# Patient Record
Sex: Male | Born: 1975
Health system: Southern US, Community
[De-identification: ages and names within clinical notes are randomized; demographics above are authoritative.]

## PROBLEM LIST (undated history)

## (undated) DIAGNOSIS — Z8489 Family history of other specified conditions: Secondary | ICD-10-CM

## (undated) DIAGNOSIS — F319 Bipolar disorder, unspecified: Secondary | ICD-10-CM

## (undated) DIAGNOSIS — F1011 Alcohol abuse, in remission: Secondary | ICD-10-CM

## (undated) DIAGNOSIS — F329 Major depressive disorder, single episode, unspecified: Secondary | ICD-10-CM

## (undated) DIAGNOSIS — L989 Disorder of the skin and subcutaneous tissue, unspecified: Secondary | ICD-10-CM

## (undated) DIAGNOSIS — E785 Hyperlipidemia, unspecified: Secondary | ICD-10-CM

## (undated) DIAGNOSIS — F32A Depression, unspecified: Secondary | ICD-10-CM

## (undated) DIAGNOSIS — G8929 Other chronic pain: Secondary | ICD-10-CM

## (undated) DIAGNOSIS — M545 Low back pain, unspecified: Secondary | ICD-10-CM

## (undated) DIAGNOSIS — R109 Unspecified abdominal pain: Secondary | ICD-10-CM

## (undated) DIAGNOSIS — Z973 Presence of spectacles and contact lenses: Secondary | ICD-10-CM

## (undated) DIAGNOSIS — F411 Generalized anxiety disorder: Secondary | ICD-10-CM

## (undated) DIAGNOSIS — T8859XA Other complications of anesthesia, initial encounter: Secondary | ICD-10-CM

## (undated) DIAGNOSIS — Z87442 Personal history of urinary calculi: Secondary | ICD-10-CM

## (undated) DIAGNOSIS — R911 Solitary pulmonary nodule: Secondary | ICD-10-CM

## (undated) HISTORY — DX: Disorder of the skin and subcutaneous tissue, unspecified: L98.9

## (undated) HISTORY — DX: Hyperlipidemia, unspecified: E78.5

## (undated) HISTORY — DX: Major depressive disorder, single episode, unspecified: F32.9

## (undated) HISTORY — DX: Unspecified abdominal pain: R10.9

## (undated) HISTORY — PX: HERNIA REPAIR: SHX51

## (undated) HISTORY — DX: Alcohol abuse, in remission: F10.11

## (undated) HISTORY — DX: Bipolar disorder, unspecified: F31.9

## (undated) HISTORY — DX: Generalized anxiety disorder: F41.1

---
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
## 2008-04-09 ENCOUNTER — Ambulatory Visit: Payer: Self-pay | Admitting: Internal Medicine

## 2008-04-09 LAB — CONVERTED CEMR LAB
AST: 18 units/L (ref 0–37)
Alkaline Phosphatase: 67 units/L (ref 39–117)
Bilirubin Urine: NEGATIVE
Bilirubin, Direct: 0.2 mg/dL (ref 0.0–0.3)
Chloride: 100 meq/L (ref 96–112)
Eosinophils Absolute: 0.2 10*3/uL (ref 0.0–0.7)
Eosinophils Relative: 2.4 % (ref 0.0–5.0)
GFR calc Af Amer: 113 mL/min
GFR calc non Af Amer: 93 mL/min
Glucose, Bld: 88 mg/dL (ref 70–99)
HCT: 49.9 % (ref 39.0–52.0)
HDL: 34.9 mg/dL — ABNORMAL LOW (ref >39.0)
Hemoglobin, Urine: NEGATIVE
Monocytes Absolute: 0.5 10*3/uL (ref 0.1–1.0)
Monocytes Relative: 6.5 % (ref 3.0–12.0)
Neutrophils Relative %: 59.5 % (ref 43.0–77.0)
Nitrite: NEGATIVE
Platelets: 202 10*3/uL (ref 150–400)
Potassium: 3.8 meq/L (ref 3.5–5.1)
RDW: 11.6 % (ref 11.5–14.6)
Sodium: 138 meq/L (ref 135–145)
Total CHOL/HDL Ratio: 5.1
Total Protein, Urine: NEGATIVE mg/dL
Triglycerides: 371 mg/dL (ref 0–149)
Urobilinogen, UA: 0.2 (ref 0.0–1.0)
VLDL: 74 mg/dL — ABNORMAL HIGH (ref 0–40)
WBC: 7 10*3/uL (ref 4.5–10.5)

## 2008-04-17 ENCOUNTER — Ambulatory Visit: Payer: Self-pay | Admitting: Internal Medicine

## 2008-04-17 DIAGNOSIS — F411 Generalized anxiety disorder: Secondary | ICD-10-CM

## 2008-04-17 DIAGNOSIS — L989 Disorder of the skin and subcutaneous tissue, unspecified: Secondary | ICD-10-CM | POA: Insufficient documentation

## 2008-04-17 DIAGNOSIS — F329 Major depressive disorder, single episode, unspecified: Secondary | ICD-10-CM

## 2008-04-17 DIAGNOSIS — F3289 Other specified depressive episodes: Secondary | ICD-10-CM

## 2008-04-17 HISTORY — DX: Generalized anxiety disorder: F41.1

## 2008-04-17 HISTORY — DX: Major depressive disorder, single episode, unspecified: F32.9

## 2008-04-17 HISTORY — DX: Other specified depressive episodes: F32.89

## 2008-04-17 HISTORY — DX: Disorder of the skin and subcutaneous tissue, unspecified: L98.9

## 2008-04-26 ENCOUNTER — Telehealth: Payer: Self-pay | Admitting: Internal Medicine

## 2008-06-06 ENCOUNTER — Ambulatory Visit: Payer: Self-pay | Admitting: Internal Medicine

## 2008-06-06 DIAGNOSIS — R109 Unspecified abdominal pain: Secondary | ICD-10-CM | POA: Insufficient documentation

## 2008-06-06 DIAGNOSIS — K4090 Unilateral inguinal hernia, without obstruction or gangrene, not specified as recurrent: Secondary | ICD-10-CM

## 2008-06-06 HISTORY — DX: Unilateral inguinal hernia, without obstruction or gangrene, not specified as recurrent: K40.90

## 2008-06-06 HISTORY — DX: Unspecified abdominal pain: R10.9

## 2009-07-10 ENCOUNTER — Ambulatory Visit: Payer: Self-pay | Admitting: Internal Medicine

## 2009-08-04 ENCOUNTER — Telehealth: Payer: Self-pay | Admitting: Internal Medicine

## 2009-08-04 ENCOUNTER — Encounter: Payer: Self-pay | Admitting: Internal Medicine

## 2009-08-05 ENCOUNTER — Telehealth: Payer: Self-pay | Admitting: Internal Medicine

## 2010-07-17 ENCOUNTER — Telehealth: Payer: Self-pay | Admitting: Internal Medicine

## 2010-07-21 ENCOUNTER — Ambulatory Visit: Payer: Self-pay | Admitting: Internal Medicine

## 2010-07-24 ENCOUNTER — Ambulatory Visit: Payer: Self-pay | Admitting: Internal Medicine

## 2010-07-25 LAB — CONVERTED CEMR LAB
AST: 22 units/L (ref 0–37)
Albumin: 4.2 g/dL (ref 3.5–5.2)
BUN: 10 mg/dL (ref 6–23)
Basophils Absolute: 0 10*3/uL (ref 0.0–0.1)
CO2: 28 meq/L (ref 19–32)
Chloride: 99 meq/L (ref 96–112)
Cholesterol: 199 mg/dL (ref 0–200)
Direct LDL: 126.7 mg/dL
Eosinophils Absolute: 0.1 10*3/uL (ref 0.0–0.7)
Glucose, Bld: 79 mg/dL (ref 70–99)
HCT: 47.2 % (ref 39.0–52.0)
HDL: 30.1 mg/dL — ABNORMAL LOW (ref >39.00)
Hemoglobin, Urine: NEGATIVE
Hemoglobin: 16.8 g/dL (ref 13.0–17.0)
Ketones, ur: NEGATIVE mg/dL
Lymphs Abs: 1.9 10*3/uL (ref 0.7–4.0)
MCHC: 35.6 g/dL (ref 30.0–36.0)
MCV: 90.2 fL (ref 78.0–100.0)
Monocytes Absolute: 0.7 10*3/uL (ref 0.1–1.0)
Monocytes Relative: 13.4 % — ABNORMAL HIGH (ref 3.0–12.0)
Neutro Abs: 2.2 10*3/uL (ref 1.4–7.7)
Potassium: 4 meq/L (ref 3.5–5.1)
RDW: 12.8 % (ref 11.5–14.6)
Sodium: 136 meq/L (ref 135–145)
TSH: 1.4 microintl units/mL (ref 0.35–5.50)
Total Protein, Urine: NEGATIVE mg/dL
Urine Glucose: NEGATIVE mg/dL

## 2010-11-06 ENCOUNTER — Ambulatory Visit
Admission: RE | Admit: 2010-11-06 | Discharge: 2010-11-06 | Payer: Self-pay | Source: Home / Self Care | Attending: Endocrinology | Admitting: Endocrinology

## 2010-11-06 DIAGNOSIS — J069 Acute upper respiratory infection, unspecified: Secondary | ICD-10-CM | POA: Insufficient documentation

## 2010-12-01 NOTE — Progress Notes (Signed)
Summary: paroxetine  Phone Note Call from Patient   Caller: Patient Reason for Call: Talk to Doctor Summary of Call: Pt left msg on vm need rx for paroxetine sent to his mail service. Pls return call back @ [phone] Initial call taken by: Orlan Leavens RMA,  July 17, 2010 12:19 PM  Follow-up for Phone Call        Called pt back can not send 90 day to mail service. Need to have yearly f/u. will send 30 day to local pharmacy until f/u appt. Follow-up by: Orlan Leavens RMA,  July 17, 2010 1:36 PM

## 2010-12-01 NOTE — Assessment & Plan Note (Signed)
Summary: PER LUCY YEARLY FU---STC   Vital Signs:  Patient profile:   35 year old male Height:      67 inches Weight:      185.25 pounds BMI:     29.12 O2 Sat:      98 % on Room air Temp:     98.3 degrees F oral Pulse rate:   67 / minute BP sitting:   114 / 72  (left arm) Cuff size:   regular  Vitals Entered By: Margaret Pyle, CMA (July 21, 2010 4:39 PM)  O2 Flow:  Room air CC: Follow up on medication   CC:  Follow up on medication.  History of Present Illness: here for wellness and f/u - overall doing well, has some adverse serotonin w/d type symptoms if he misses 3 days paxil, but overall good complaicne, good tolerability, good efficacy and he wants to continue.  No signficant sexual side effect, wt gain.  No new complaints.  Pt denies CP, worsening sob, doe, wheezing, orthopnea, pnd, worsening LE edema, palps, dizziness or syncope  Pt denies new neuro symptoms such as headache, facial or extremity weakness No headache or sleep issues.  Now working full time as Sport and exercise psychologist.  No worsening depressive symtpoms, suicidal ideation, panic.  Problems Prior to Update: 1)  Inguinal Pain, Right  (ICD-789.09) 2)  Skin Lesion  (ICD-709.9) 3)  Preventive Health Care  (ICD-V70.0) 4)  Depression  (ICD-311) 5)  Anxiety  (ICD-300.00)  Medications Prior to Update: 1)  Paxil 30 Mg Tabs (Paroxetine Hcl) .... Take 1 Once Daily  Current Medications (verified): 1)  Paxil 30 Mg Tabs (Paroxetine Hcl) .... Take 1 Once Daily  Allergies (verified): No Known Drug Allergies  Past History:  Past Medical History: Last updated: 04/17/2008 borderline HTN Anxiety Depression hx of ETOH  Past Surgical History: Last updated: 04/17/2008 congenital ureter malformation repair 1994 Inguinal herniorrhaphy - left  Family History: Last updated: 04/17/2008 several maternal males with ETOH grandfather with CAD/MI  Social History: Last updated: 07/21/2010 Divorced 1 child work  - now Investment banker, operational  Current Smoker Alcohol use-no since 3/09  Risk Factors: Smoking Status: current (04/17/2008)  Social History: Divorced 1 child work - now Investment banker, operational  Current Smoker Alcohol use-no since 3/09  Review of Systems  The patient denies anorexia, fever, weight loss, weight gain, vision loss, decreased hearing, hoarseness, chest pain, syncope, dyspnea on exertion, peripheral edema, prolonged cough, headaches, hemoptysis, abdominal pain, melena, hematochezia, severe indigestion/heartburn, hematuria, muscle weakness, suspicious skin lesions, transient blindness, difficulty walking, depression, unusual weight change, abnormal bleeding, enlarged lymph nodes, and angioedema.         all otherwise negative per pt -  except for recurrent mild right lower back with some radiaion to the right post thigh, mild , without other LE pain/weak/numb, no bowel or bladder change, no fever or gait change, injury or fall  Physical Exam  General:  alert and well-developed.   Head:  normocephalic and atraumatic.   Eyes:  vision grossly intact, pupils equal, and pupils round.   Ears:  R ear normal and L ear normal.   Nose:  no external deformity and no nasal discharge.   Mouth:  no gingival abnormalities and pharynx pink and moist.   Neck:  supple and no masses.   Lungs:  normal respiratory effort and normal breath sounds.   Heart:  normal rate and regular rhythm.   Abdomen:  soft, non-tender, and normal bowel sounds.   Msk:  no  joint tenderness and no joint swelling.   Extremities:  no edema, no erythema  Neurologic:  cranial nerves II-XII intact and strength normal in all extremities.   Skin:  color normal and no rashes.   Psych:  not anxious appearing and not depressed appearing.     Impression & Recommendations:  Problem # 1:  Preventive Health Care (ICD-V70.0) Overall doing well, age appropriate education and counseling updated and referral for appropriate preventive  services done unless declined, immunizations up to date or declined, diet counseling done if overweight, urged to quit smoking if smokes , most recent labs reviewed and current ordered if appropriate, ecg reviewed or declined (interpretation per ECG scanned in the EMR if done); information regarding Medicare Prevention requirements given if appropriate; speciality referrals updated as appropriate ; for labs later this wk per pt preference  Problem # 2:  ANXIETY (ICD-300.00)  His updated medication list for this problem includes:    Paxil 30 Mg Tabs (Paroxetine hcl) .Marland Kitchen... Take 1 once daily stable overall by hx and exam, ok to continue meds/tx as is   Complete Medication List: 1)  Paxil 30 Mg Tabs (Paroxetine hcl) .... Take 1 once daily  Other Orders: Admin 1st Vaccine (16109) Flu Vaccine 48yrs + (60454) Flu Vaccine Consent Questions     Do you have a history of severe allergic reactions to this vaccine? no    Any prior history of allergic reactions to egg and/or gelatin? no    Do you have a sensitivity to the preservative Thimersol? no    Do you have a past history of Guillan-Barre Syndrome? no    Do you currently have an acute febrile illness? no    Have you ever had a severe reaction to latex? no    Vaccine information given and explained to patient? yes    Are you currently pregnant? no    Lot Number:[id number]   Exp Date:05/01/2011   Site Given  Left Deltoid IMOrders: Admin 1st Vaccine (09811) Flu Vaccine 9yrs + (91478)  Patient Instructions: 1)  Continue all previous medications as before this visit 2)  You will be called about coming for the blood work 3)  Please call the number on the [facility] Card for results of your testing  4)  Please schedule a follow-up appointment in 1 year or sooner if needed Prescriptions: PAXIL 30 MG TABS (PAROXETINE HCL) take 1 once daily  #90 x 3   Entered and Authorized by:   Corwin Levins MD   Signed by:   Corwin Levins MD on 07/21/2010   Method used:    Print then Give to Patient   RxID:   [id number]  .lbflu

## 2010-12-03 NOTE — Assessment & Plan Note (Signed)
Summary: SINUS INFECTION/NWS   Vital Signs:  Patient profile:   35 year old male Height:      67 inches (170.18 cm) Weight:      184.50 pounds (83.86 kg) BMI:     29.00 O2 Sat:      94 % on Room air Temp:     98.5 degrees F (36.94 degrees C) oral Pulse rate:   92 / minute Pulse rhythm:   regular BP sitting:   138 / 98  (left arm) Cuff size:   regular  Vitals Entered By: Brenton Grills CMA (AAMA) (November 06, 2010 9:22 AM)  O2 Flow:  Room air CC: Sinus infection x 5 days/aj Is Patient Diabetic? No   CC:  Sinus infection x 5 days/aj.  History of Present Illness: 5 days of nasal congestion, and assoc sore throat.  Current Medications (verified): 1)  Paxil 30 Mg Tabs (Paroxetine Hcl) .... Take 1 Once Daily  Allergies (verified): No Known Drug Allergies  Past History:  Past Medical History: Last updated: 04/17/2008 borderline HTN Anxiety Depression hx of ETOH  Review of Systems  The patient denies fever and prolonged cough.         no earache  Physical Exam  General:  normal appearance.   Head:  head: no deformity eyes: no periorbital swelling, no proptosis external nose and ears are normal mouth: no lesion seen Neck:  Supple without thyroid enlargement or tenderness.  Lungs:  Clear to auscultation bilaterally. Normal respiratory effort.    Impression & Recommendations:  Problem # 1:  URI (ICD-465.9) Assessment New  Medications Added to Medication List This Visit: 1)  Azithromycin 500 Mg Tabs (Azithromycin) .Marland Kitchen.. 1 tab once daily  Other Orders: Est. Patient Level III (04540)  Patient Instructions: 1)  azithromycin 500 mg once daily 2)  loratadine-d as needed for congestion 3)  return here as needed Prescriptions: AZITHROMYCIN 500 MG TABS (AZITHROMYCIN) 1 tab once daily  #6 x 0   Entered and Authorized by:   Minus Breeding MD   Signed by:   Minus Breeding MD on 11/06/2010   Method used:   Electronically to        [facility] [phone]*  (retail)       [address], [place]  [address]       Ph: [phone] or [phone]       Fax: [phone]   RxID:   [id number]    Orders Added: 1)  Est. Patient Level III [59563]

## 2011-07-22 ENCOUNTER — Telehealth: Payer: Self-pay

## 2011-07-22 DIAGNOSIS — Z0000 Encounter for general adult medical examination without abnormal findings: Secondary | ICD-10-CM

## 2011-07-22 NOTE — Telephone Encounter (Signed)
Put order in for physical labs. 

## 2011-07-26 ENCOUNTER — Telehealth: Payer: Self-pay

## 2011-07-26 NOTE — Telephone Encounter (Signed)
Patient called to confirm that physical labs had been ordered and informed order had been put in.

## 2011-07-28 ENCOUNTER — Other Ambulatory Visit: Payer: Self-pay | Admitting: Internal Medicine

## 2011-07-28 ENCOUNTER — Telehealth: Payer: Self-pay

## 2011-07-28 ENCOUNTER — Other Ambulatory Visit (INDEPENDENT_AMBULATORY_CARE_PROVIDER_SITE_OTHER): Payer: 59

## 2011-07-28 DIAGNOSIS — F329 Major depressive disorder, single episode, unspecified: Secondary | ICD-10-CM

## 2011-07-28 DIAGNOSIS — Z0000 Encounter for general adult medical examination without abnormal findings: Secondary | ICD-10-CM

## 2011-07-28 LAB — BASIC METABOLIC PANEL
CO2: 30 mEq/L (ref 19–32)
Calcium: 9.4 mg/dL (ref 8.4–10.5)
Creatinine, Ser: 0.9 mg/dL (ref 0.4–1.5)
Glucose, Bld: 80 mg/dL (ref 70–99)

## 2011-07-28 LAB — LDL CHOLESTEROL, DIRECT: Direct LDL: 148.8 mg/dL

## 2011-07-28 LAB — LIPID PANEL
HDL: 37.2 mg/dL — ABNORMAL LOW (ref >39.00)
Total CHOL/HDL Ratio: 6
Triglycerides: 224 mg/dL — ABNORMAL HIGH (ref 0.0–149.0)
VLDL: 44.8 mg/dL — ABNORMAL HIGH (ref 0.0–40.0)

## 2011-07-28 LAB — HEPATIC FUNCTION PANEL
Albumin: 4.3 g/dL (ref 3.5–5.2)
Alkaline Phosphatase: 67 U/L (ref 39–117)
Total Protein: 7.4 g/dL (ref 6.0–8.3)

## 2011-07-28 LAB — URINALYSIS, ROUTINE W REFLEX MICROSCOPIC
Bilirubin Urine: NEGATIVE
Hgb urine dipstick: NEGATIVE
Urine Glucose: NEGATIVE
Urobilinogen, UA: 1 (ref 0.0–1.0)

## 2011-07-28 LAB — CBC WITH DIFFERENTIAL/PLATELET
Eosinophils Relative: 2.3 % (ref 0.0–5.0)
HCT: 51.2 % (ref 39.0–52.0)
Hemoglobin: 17.6 g/dL — ABNORMAL HIGH (ref 13.0–17.0)
Lymphs Abs: 1.8 10*3/uL (ref 0.7–4.0)
Monocytes Relative: 10.2 % (ref 3.0–12.0)
Neutro Abs: 3.3 10*3/uL (ref 1.4–7.7)
RDW: 12.3 % (ref 11.5–14.6)
WBC: 5.8 10*3/uL (ref 4.5–10.5)

## 2011-07-28 NOTE — Telephone Encounter (Signed)
Lab order done 

## 2011-07-28 NOTE — Telephone Encounter (Signed)
Do not know the  codes

## 2011-07-28 NOTE — Telephone Encounter (Signed)
Message copied by Pincus Sanes on Wed Jul 28, 2011  5:00 PM ------      Message from: Corwin Levins      Created: Wed Jul 28, 2011 12:23 PM      Regarding: RE: Lab order       Do we know the codes?      ----- Message -----         From: Scharlene Gloss         Sent: 07/28/2011   8:33 AM           To: Oliver Barre, MD      Subject: Lab order                                                Lab called need to put order in for Valporic acid and liver function for the patient, Dr. Arta Silence is requesting.

## 2011-07-29 ENCOUNTER — Other Ambulatory Visit (INDEPENDENT_AMBULATORY_CARE_PROVIDER_SITE_OTHER): Payer: 59

## 2011-07-29 DIAGNOSIS — F329 Major depressive disorder, single episode, unspecified: Secondary | ICD-10-CM

## 2011-07-30 LAB — HEPATIC FUNCTION PANEL
Albumin: 4.9 g/dL (ref 3.5–5.2)
Bilirubin, Direct: 0.2 mg/dL (ref 0.0–0.3)
Total Protein: 8 g/dL (ref 6.0–8.3)

## 2011-08-31 ENCOUNTER — Encounter: Payer: Self-pay | Admitting: Internal Medicine

## 2011-09-03 ENCOUNTER — Encounter: Payer: Self-pay | Admitting: Internal Medicine

## 2011-09-03 DIAGNOSIS — Z0000 Encounter for general adult medical examination without abnormal findings: Secondary | ICD-10-CM | POA: Insufficient documentation

## 2011-09-08 ENCOUNTER — Ambulatory Visit (INDEPENDENT_AMBULATORY_CARE_PROVIDER_SITE_OTHER): Payer: 59 | Admitting: Internal Medicine

## 2011-09-08 ENCOUNTER — Encounter: Payer: Self-pay | Admitting: Internal Medicine

## 2011-09-08 VITALS — BP 120/84 | HR 70 | Temp 97.8°F | Ht 67.5 in | Wt 187.1 lb

## 2011-09-08 DIAGNOSIS — Z0000 Encounter for general adult medical examination without abnormal findings: Secondary | ICD-10-CM

## 2011-09-08 DIAGNOSIS — E785 Hyperlipidemia, unspecified: Secondary | ICD-10-CM

## 2011-09-08 DIAGNOSIS — Z23 Encounter for immunization: Secondary | ICD-10-CM

## 2011-09-08 DIAGNOSIS — F319 Bipolar disorder, unspecified: Secondary | ICD-10-CM

## 2011-09-08 HISTORY — DX: Bipolar disorder, unspecified: F31.9

## 2011-09-08 HISTORY — DX: Hyperlipidemia, unspecified: E78.5

## 2011-09-08 MED ORDER — LAMOTRIGINE 100 MG PO TABS: 100.0000 mg | ORAL_TABLET | Freq: Every day | ORAL | Status: DC

## 2011-09-08 MED ORDER — DIVALPROEX SODIUM 500 MG PO DR TAB: 500.0000 mg | DELAYED_RELEASE_TABLET | Freq: Three times a day (TID) | ORAL | Status: DC

## 2011-09-08 NOTE — Progress Notes (Signed)
Addended by: Scharlene Gloss B on: 09/08/2011 11:30 AM   Modules accepted: Orders

## 2011-09-08 NOTE — Patient Instructions (Addendum)
You had the flu shot today Continue all other medications as before Please keep your appointments with your specialists as you have planned - psychiatry You are otherwise up to date today Plesae follow lower cholesterol, lower fat diet Please return in 1 year for your yearly visit, or sooner if needed, with Lab testing done 3-5 days before

## 2011-09-08 NOTE — Progress Notes (Signed)
Subjective:    Patient ID: Kyle Welch, male    DOB: 1976/10/01, 35 y.o.   MRN: [id number]  HPI  Here for wellness and f/u;  Overall doing ok;  Pt denies CP, worsening SOB, DOE, wheezing, orthopnea, PND, worsening LE edema, palpitations, dizziness or syncope.  Pt denies neurological change such as new Headache, facial or extremity weakness.  Pt denies polydipsia, polyuria, or low sugar symptoms. Pt states overall good compliance with treatment and medications, good tolerability, and trying to follow lower cholesterol diet.  Pt denies worsening depressive symptoms, suicidal ideation or panic. No fever, wt loss, night sweats, loss of appetite, or other constitutional symptoms.  Pt states good ability with ADL's, low fall risk, home safety reviewed and adequate, no significant changes in hearing or vision, and occasionally active with exercise.  Stopped abusing ETOH, has seen psychiatry, now off the paxil and on lamictal and valproic acid for bipolar, much improved.  Has some mild reflux he controlls with diet and otc meds Past Medical History  Diagnosis Date  . ANXIETY 04/17/2008  . DEPRESSION 04/17/2008  . INGUINAL PAIN, RIGHT 06/06/2008  . SKIN LESION 04/17/2008  . H/O alcohol abuse   . Bipolar affective 09/08/2011   Past Surgical History  Procedure Date  . Congenital ureter malformation repair 1994  . Hernia repair     Left    reports that he has been smoking.  He does not have any smokeless tobacco history on file. He reports that he drinks alcohol. He reports that he does not use illicit drugs. family history includes Alcohol abuse in his maternal uncle and other and Coronary artery disease in his maternal grandfather. No Known Allergies Current Outpatient Prescriptions on File Prior to Visit  Medication Sig Dispense Refill  . PARoxetine (PAXIL) 30 MG tablet Take 30 mg by mouth every morning.         Review of Systems Review of Systems  Constitutional: Negative for diaphoresis, activity  change, appetite change and unexpected weight change.  HENT: Negative for hearing loss, ear pain, facial swelling, mouth sores and neck stiffness.   Eyes: Negative for pain, redness and visual disturbance.  Respiratory: Negative for shortness of breath and wheezing.   Cardiovascular: Negative for chest pain and palpitations.  Gastrointestinal: Negative for diarrhea, blood in stool, abdominal distention and rectal pain.  Genitourinary: Negative for hematuria, flank pain and decreased urine volume.  Musculoskeletal: Negative for myalgias and joint swelling.  Skin: Negative for color change and wound.  Neurological: Negative for syncope and numbness.  Hematological: Negative for adenopathy.  Psychiatric/Behavioral: Negative for hallucinations, self-injury, decreased concentration and agitation.      Objective:   Physical Exam BP 120/84  Pulse 70  Temp(Src) 97.8 F (36.6 C) (Oral)  Ht 5' 7.5" (1.715 m)  Wt 187 lb 2 oz (84.879 kg)  BMI 28.88 kg/m2  SpO2 98% Physical Exam  VS noted Constitutional: Pt is oriented to person, place, and time. Appears well-developed and well-nourished.  HENT:  Head: Normocephalic and atraumatic.  Right Ear: External ear normal.  Left Ear: External ear normal.  Nose: Nose normal.  Mouth/Throat: Oropharynx is clear and moist.  Eyes: Conjunctivae and EOM are normal. Pupils are equal, round, and reactive to light.  Neck: Normal range of motion. Neck supple. No JVD present. No tracheal deviation present.  Cardiovascular: Normal rate, regular rhythm, normal heart sounds and intact distal pulses.   Pulmonary/Chest: Effort normal and breath sounds normal.  Abdominal: Soft. Bowel sounds are normal. There  is no tenderness.  Musculoskeletal: Normal range of motion. Exhibits no edema.  Lymphadenopathy:  Has no cervical adenopathy.  Neurological: Pt is alert and oriented to person, place, and time. Pt has normal reflexes. No cranial nerve deficit.  Skin: Skin is  warm and dry. No rash noted.  Psychiatric:  Has  normal mood and affect. Behavior is normal. Not agitated, anxious or depressed affect today    Assessment & Plan:

## 2011-11-09 ENCOUNTER — Ambulatory Visit: Payer: 59 | Admitting: Professional

## 2013-09-26 ENCOUNTER — Emergency Department (HOSPITAL_COMMUNITY)
Admission: EM | Admit: 2013-09-26 | Discharge: 2013-09-26 | Disposition: A | Discharge status: Home / Self Care | Payer: BC Managed Care – PPO | Source: Home / Self Care | Attending: Emergency Medicine | Admitting: Emergency Medicine

## 2013-09-26 ENCOUNTER — Encounter (HOSPITAL_COMMUNITY): Payer: Self-pay | Admitting: Emergency Medicine

## 2013-09-26 DIAGNOSIS — M779 Enthesopathy, unspecified: Secondary | ICD-10-CM

## 2013-09-26 MED ORDER — MELOXICAM 15 MG PO TABS: 15.0000 mg | ORAL_TABLET | Freq: Every day | ORAL | Status: DC

## 2013-09-26 MED ORDER — HYDROCODONE-ACETAMINOPHEN 5-325 MG PO TABS: ORAL_TABLET | ORAL | Status: DC

## 2013-09-26 NOTE — ED Provider Notes (Signed)
Chief Complaint:   Chief Complaint  Patient presents with  . Arm Pain    History of Present Illness:   Kyle Welch is a 37 year old male who did some yard work 5 days ago. He worked for hours in his yard using a lunar. Thereafter he had pain in his left wrist overlying the radial styloid and over the dorsum of the right hand. There was swelling of both of these areas. It hurts him to use his hands. He denies any numbness or tingling. She's never had anything like this before.  Review of Systems:  Other than noted above, the patient denies any of the following symptoms: Systemic:  No fevers, chills, or sweats.  No fatigue or tiredness. Musculoskeletal:  No joint pain, arthritis, bursitis, swelling, back pain, or neck pain.  Neurological:  No muscular weakness, paresthesias.  PMFSH:  Past medical history, family history, social history, meds, and allergies were reviewed.  No history of gout.    Physical Exam:   Vital signs:  BP 136/99  Pulse 70  Temp(Src) 97.6 F (36.4 C) (Oral)  Resp 16  SpO2 100% Gen:  Alert and oriented times 3.  In no distress. Musculoskeletal:  Exam of the hand reveals there is pain to palpation over the radial styloid of the left hand and over the course of the extensor tendon of the thumb. There is pain with movement of the thumb and he has a positive Finkelstein's test. There is no pain over the remainder of the wrist or the dorsum of the hand and he has not had pain over the MCP joints, PIPs, or DIPs. Exam of the right hand reveals pain and swelling over the dorsum of the hand. No pain over the wrist, MCPs, PIPs, DIPs on the right. Lourena Simmonds test was negative on the right.  Otherwise, all joints had a full a ROM with no swelling, bruising or deformity.  No edema, pulses full. Extremities were warm and pink.  Capillary refill was brisk.  Skin:  Clear, warm and dry.  No rash. Neuro:  Alert and oriented times 3.  Muscle strength was normal.  Sensation was intact to  light touch.   Course in Urgent Care Center:   The left hand was placed in a thumb spica.  Assessment:  The encounter diagnosis was Tendonitis.  He appears to have de Quervain synovitis on the left and extensor tendinitis on the right. Both of these are due to overuse, secondary to yard work that he did at onset of symptoms. He needs to rest his hands and arms for the next couple weeks. He was given Mobic and Norco for the pain. I offered him an a corticosteroid injection in the left wrist, but he declined.  Plan:   1.  Meds:  The following meds were prescribed:   Discharge Medication List as of 09/26/2013  5:10 PM    START taking these medications   Details  HYDROcodone-acetaminophen (NORCO/VICODIN) 5-325 MG per tablet 1 to 2 tabs every 4 to 6 hours as needed for pain., Print    meloxicam (MOBIC) 15 MG tablet Take 1 tablet (15 mg total) by mouth daily., Starting 09/26/2013, Until Discontinued, Normal        2.  Patient Education/Counseling:  The patient was given appropriate handouts, self care instructions, and instructed in symptomatic relief, including rest and activity, elevation, application of ice and compression.   3.  Follow up:  The patient was told to follow up if no better in 3  to 4 days, if becoming worse in any way, and given some red flag symptoms such as worsening pain which would prompt immediate return.  Follow up with Dr. Izora Ribas if no improvement in 2 weeks.      Reuben Likes, MD 09/26/13 2100

## 2013-09-26 NOTE — ED Notes (Signed)
Overuse of both hands doing yard work

## 2013-11-09 ENCOUNTER — Ambulatory Visit: Payer: BC Managed Care – PPO | Admitting: Internal Medicine

## 2013-11-15 ENCOUNTER — Other Ambulatory Visit: Payer: Self-pay | Admitting: Internal Medicine

## 2013-11-15 DIAGNOSIS — R222 Localized swelling, mass and lump, trunk: Secondary | ICD-10-CM

## 2013-11-19 ENCOUNTER — Other Ambulatory Visit: Payer: BC Managed Care – PPO

## 2013-11-29 ENCOUNTER — Other Ambulatory Visit: Payer: BC Managed Care – PPO

## 2013-12-20 ENCOUNTER — Ambulatory Visit (INDEPENDENT_AMBULATORY_CARE_PROVIDER_SITE_OTHER): Payer: BC Managed Care – PPO | Admitting: General Surgery

## 2014-04-22 ENCOUNTER — Encounter (INDEPENDENT_AMBULATORY_CARE_PROVIDER_SITE_OTHER): Payer: Self-pay | Admitting: General Surgery

## 2014-04-22 ENCOUNTER — Ambulatory Visit (INDEPENDENT_AMBULATORY_CARE_PROVIDER_SITE_OTHER): Payer: BC Managed Care – PPO | Admitting: General Surgery

## 2014-04-22 DIAGNOSIS — D179 Benign lipomatous neoplasm, unspecified: Secondary | ICD-10-CM

## 2014-04-22 NOTE — Patient Instructions (Signed)
Please call and tell us whether you would like to have the surgery or not.

## 2014-04-22 NOTE — Progress Notes (Signed)
Patient ID: Kyle Welch, male   DOB: 1976-04-16, 38 y.o.   MRN: [id number]  No chief complaint on file.   HPI Kyle Welch is a 38 y.o. male.   HPI  He is referred by Dr. Shelia Media for further evaluation of a left chest wall soft tissue mass.  This is an present for about 5-6 years. Sometimes there is a little hypersensitivity in the skin surrounding the area. He's noticed a little bit of the inferior extension recently. There is been no significant increase in size however  Past Medical History  Diagnosis Date  . ANXIETY 04/17/2008  . DEPRESSION 04/17/2008  . INGUINAL PAIN, RIGHT 06/06/2008  . SKIN LESION 04/17/2008  . H/O alcohol abuse   . Bipolar affective 09/08/2011  . Hyperlipidemia 09/08/2011    Past Surgical History  Procedure Laterality Date  . Congenital ureter malformation repair  1994  . Hernia repair      Left    Family History  Problem Relation Age of Onset  . Alcohol abuse Maternal Uncle     several  . Coronary artery disease Maternal Grandfather   . Alcohol abuse Other     Social History History  Substance Use Topics  . Smoking status: Current Every Day Smoker  . Smokeless tobacco: Not on file  . Alcohol Use: Yes     Comment: none sine 12/2007    No Known Allergies  Current Outpatient Prescriptions  Medication Sig Dispense Refill  . divalproex (DEPAKOTE) 500 MG DR tablet Take 1 tablet (500 mg total) by mouth 3 (three) times daily.  30 tablet  11  . HYDROcodone-acetaminophen (NORCO/VICODIN) 5-325 MG per tablet 1 to 2 tabs every 4 to 6 hours as needed for pain.  20 tablet  0  . lamoTRIgine (LAMICTAL) 100 MG tablet Take 1 tablet (100 mg total) by mouth at bedtime.  30 tablet  11  . meloxicam (MOBIC) 15 MG tablet Take 1 tablet (15 mg total) by mouth daily.  15 tablet  1   No current facility-administered medications for this visit.    Review of Systems Review of Systems  Constitutional: Negative.   Respiratory: Negative.   Cardiovascular: Positive for chest  pain (Occasional at site of soft tissue mass).  Gastrointestinal: Negative.   Hematological: Negative.     There were no vitals taken for this visit.  Physical Exam Physical Exam  Constitutional: He appears well-developed and well-nourished. No distress.  HENT:  Head: Normocephalic and atraumatic.  Pulmonary/Chest:  2.5 cm mobile soft tissue mass left lower chest area. There is a small inferior extension of this.  Abdominal:  No abdominal wall masses. Left flank scar.    Data Reviewed Note from Dr. Shelia Media  Assessment    Left chest wall soft tissue mass. Clinically most consistent with a lipoma. Causes some occasional discomfort and has slightly changed.     Plan    We discussed removal of the mass or continued expectant management. I went over the procedure and risks of removal. Risks include but are not limited to bleeding, infection, wound healing problems, reaction to anesthesia. He would like to go home and discuss it with his wife and will call us back with his decision.        Bracken Moffa J 04/22/2014, 3:29 PM

## 2015-06-16 ENCOUNTER — Other Ambulatory Visit: Payer: Self-pay | Admitting: Orthopaedic Surgery

## 2015-06-16 DIAGNOSIS — M545 Low back pain: Secondary | ICD-10-CM

## 2015-06-23 ENCOUNTER — Ambulatory Visit
Admission: RE | Admit: 2015-06-23 | Discharge: 2015-06-23 | Disposition: A | Discharge status: Home / Self Care | Payer: BLUE CROSS/BLUE SHIELD | Source: Ambulatory Visit | Attending: Orthopaedic Surgery | Admitting: Orthopaedic Surgery

## 2015-06-23 DIAGNOSIS — M545 Low back pain: Secondary | ICD-10-CM

## 2016-02-10 DIAGNOSIS — H52223 Regular astigmatism, bilateral: Secondary | ICD-10-CM | POA: Diagnosis not present

## 2016-03-16 DIAGNOSIS — F419 Anxiety disorder, unspecified: Secondary | ICD-10-CM | POA: Diagnosis not present

## 2016-03-16 DIAGNOSIS — F902 Attention-deficit hyperactivity disorder, combined type: Secondary | ICD-10-CM | POA: Diagnosis not present

## 2016-03-16 DIAGNOSIS — G479 Sleep disorder, unspecified: Secondary | ICD-10-CM | POA: Diagnosis not present

## 2016-03-16 DIAGNOSIS — Z79899 Other long term (current) drug therapy: Secondary | ICD-10-CM | POA: Diagnosis not present

## 2016-04-06 DIAGNOSIS — M79642 Pain in left hand: Secondary | ICD-10-CM | POA: Diagnosis not present

## 2016-04-06 DIAGNOSIS — M25562 Pain in left knee: Secondary | ICD-10-CM | POA: Diagnosis not present

## 2016-04-12 DIAGNOSIS — M5416 Radiculopathy, lumbar region: Secondary | ICD-10-CM | POA: Diagnosis not present

## 2016-04-13 ENCOUNTER — Other Ambulatory Visit: Payer: Self-pay | Admitting: Orthopedic Surgery

## 2016-04-13 DIAGNOSIS — M5416 Radiculopathy, lumbar region: Secondary | ICD-10-CM

## 2016-04-20 ENCOUNTER — Ambulatory Visit
Admission: RE | Admit: 2016-04-20 | Discharge: 2016-04-20 | Disposition: A | Discharge status: Home / Self Care | Payer: BLUE CROSS/BLUE SHIELD | Source: Ambulatory Visit | Attending: Orthopedic Surgery | Admitting: Orthopedic Surgery

## 2016-04-20 DIAGNOSIS — M4806 Spinal stenosis, lumbar region: Secondary | ICD-10-CM | POA: Diagnosis not present

## 2016-04-20 DIAGNOSIS — M5416 Radiculopathy, lumbar region: Secondary | ICD-10-CM

## 2016-04-27 DIAGNOSIS — S83207A Unspecified tear of unspecified meniscus, current injury, left knee, initial encounter: Secondary | ICD-10-CM | POA: Diagnosis not present

## 2016-05-11 DIAGNOSIS — M25562 Pain in left knee: Secondary | ICD-10-CM | POA: Diagnosis not present

## 2016-07-26 DIAGNOSIS — Z1389 Encounter for screening for other disorder: Secondary | ICD-10-CM | POA: Diagnosis not present

## 2016-07-26 DIAGNOSIS — G4752 REM sleep behavior disorder: Secondary | ICD-10-CM | POA: Diagnosis not present

## 2016-07-26 DIAGNOSIS — F418 Other specified anxiety disorders: Secondary | ICD-10-CM | POA: Diagnosis not present

## 2016-07-26 DIAGNOSIS — Z0000 Encounter for general adult medical examination without abnormal findings: Secondary | ICD-10-CM | POA: Diagnosis not present

## 2016-07-26 DIAGNOSIS — E781 Pure hyperglyceridemia: Secondary | ICD-10-CM | POA: Diagnosis not present

## 2016-07-26 DIAGNOSIS — Z23 Encounter for immunization: Secondary | ICD-10-CM | POA: Diagnosis not present

## 2016-08-31 DIAGNOSIS — K4090 Unilateral inguinal hernia, without obstruction or gangrene, not specified as recurrent: Secondary | ICD-10-CM | POA: Diagnosis not present

## 2016-08-31 DIAGNOSIS — F418 Other specified anxiety disorders: Secondary | ICD-10-CM | POA: Diagnosis not present

## 2016-09-27 DIAGNOSIS — R1084 Generalized abdominal pain: Secondary | ICD-10-CM | POA: Diagnosis not present

## 2016-09-27 DIAGNOSIS — N201 Calculus of ureter: Secondary | ICD-10-CM | POA: Diagnosis not present

## 2016-09-28 ENCOUNTER — Other Ambulatory Visit: Payer: Self-pay | Admitting: Urology

## 2016-09-28 ENCOUNTER — Encounter (HOSPITAL_COMMUNITY): Payer: Self-pay | Admitting: General Practice

## 2016-09-29 ENCOUNTER — Encounter (HOSPITAL_COMMUNITY): Payer: Self-pay | Admitting: *Deleted

## 2016-09-29 DIAGNOSIS — K4090 Unilateral inguinal hernia, without obstruction or gangrene, not specified as recurrent: Secondary | ICD-10-CM | POA: Diagnosis not present

## 2016-09-30 ENCOUNTER — Encounter (HOSPITAL_COMMUNITY): Payer: Self-pay | Admitting: *Deleted

## 2016-09-30 ENCOUNTER — Ambulatory Visit (HOSPITAL_COMMUNITY)
Admission: RE | Admit: 2016-09-30 | Discharge: 2016-09-30 | Disposition: A | Discharge status: Home / Self Care | Payer: BLUE CROSS/BLUE SHIELD | Source: Ambulatory Visit | Attending: Urology | Admitting: Urology

## 2016-09-30 ENCOUNTER — Ambulatory Visit (HOSPITAL_COMMUNITY): Payer: BLUE CROSS/BLUE SHIELD

## 2016-09-30 ENCOUNTER — Encounter (HOSPITAL_COMMUNITY)
Admission: RE | Disposition: A | Discharge status: Home / Self Care | Payer: Self-pay | Source: Ambulatory Visit | Attending: Urology

## 2016-09-30 DIAGNOSIS — N201 Calculus of ureter: Secondary | ICD-10-CM | POA: Insufficient documentation

## 2016-09-30 DIAGNOSIS — Z87891 Personal history of nicotine dependence: Secondary | ICD-10-CM | POA: Diagnosis not present

## 2016-09-30 HISTORY — DX: Personal history of urinary calculi: Z87.442

## 2016-09-30 HISTORY — DX: Family history of other specified conditions: Z84.89

## 2016-09-30 HISTORY — PX: EXTRACORPOREAL SHOCK WAVE LITHOTRIPSY: SHX1557

## 2016-09-30 SURGERY — LITHOTRIPSY, ESWL
Anesthesia: LOCAL | Laterality: Right

## 2016-09-30 MED ORDER — CIPROFLOXACIN HCL 500 MG PO TABS
500.0000 mg | ORAL_TABLET | ORAL | Status: AC
  Administered 2016-09-30: 500 mg via ORAL
  Filled 2016-09-30: qty 1

## 2016-09-30 MED ORDER — DIAZEPAM 5 MG PO TABS
10.0000 mg | ORAL_TABLET | ORAL | Status: AC
  Administered 2016-09-30: 10 mg via ORAL
  Filled 2016-09-30: qty 2

## 2016-09-30 MED ORDER — DIPHENHYDRAMINE HCL 25 MG PO CAPS
25.0000 mg | ORAL_CAPSULE | ORAL | Status: AC
  Administered 2016-09-30: 25 mg via ORAL
  Filled 2016-09-30: qty 1

## 2016-09-30 MED ORDER — SODIUM CHLORIDE 0.9 % IV SOLN
INTRAVENOUS | Status: DC
  Administered 2016-09-30: 07:00:00 via INTRAVENOUS

## 2016-09-30 NOTE — Op Note (Signed)
See [employer] OP note scanned into chart. Also because of the size, density, location and other factors that cannot be anticipated I feel this will likely be a staged procedure. This fact supersedes any indication in the scanned [facility] operative note to the contrary.  Poor fragmentation.

## 2016-09-30 NOTE — H&P (Signed)
Eval of flank pain  HPI: Kyle Welch is a 40 year-old male established patient who is here for further eval and management of flank pain.  The problem is on the right side. His pain started about approximately 09/22/2016. The pain is sharp. The pain is intermittent. The intensity of his pain is rated as a 10. The pain does radiate.   None< makes the pain better. Walking makes the pain worse. He has not been treated with any pain medications.   He has not had this same pain previously. He has not had kidney stones. He did not see the blood in his urine. The patient denies any progressive voiding symptoms. He has not had fever and chills.   The patient's pain is intermittent. In between episodes of colicky has a constant pressure. He is not taking any pain medication for this pain. He is on tamsulosin as prescribed by the company physician on staff at his office.   The patient denies nausea/vomiting.   1 year ago, the patient was noted to have a small right lower pole nonobstructing stone.     ALLERGIES: Codeine Derivatives    MEDICATIONS: Flomax  Trintellix     GU PSH: None     PSH Notes: Ureter Procedures, Inguinal Hernia Repair   NON-GU PSH: Laparoscope Proc; Ureter - 08/25/2015    GU PMH: None   NON-GU PMH: Anxiety, Anxiety and depression - 08/25/2015 Congenital malformation of kidney, unspecified, Renal anomaly - 08/25/2015 Encounter for general adult medical examination without abnormal findings, Encounter for preventive health examination - 08/25/2015    FAMILY HISTORY: No pertinent family history - Runs In Family   SOCIAL HISTORY: Marital Status: Single Current Smoking Status: Patient does not smoke anymore.  Does drink.  Drinks 1 caffeinated drink per day.     Notes: Married, Caffeine use, Occupation, Alcohol use, Former smoker, Number of children   REVIEW OF SYSTEMS:    GU Review Male:   Patient reports get up at night to urinate. Patient denies frequent  urination, erection problems, penile pain, hard to postpone urination, have to strain to urinate , trouble starting your stream, stream starts and stops, burning/ pain with urination, and leakage of urine.  Gastrointestinal (Upper):   Patient denies nausea, vomiting, and indigestion/ heartburn.  Gastrointestinal (Lower):   Patient denies diarrhea and constipation.  Constitutional:   Patient denies fever, night sweats, weight loss, and fatigue.  Skin:   Patient denies skin rash/ lesion and itching.  Eyes:   Patient denies blurred vision and double vision.  Ears/ Nose/ Throat:   Patient denies sore throat and sinus problems.  Hematologic/Lymphatic:   Patient denies swollen glands and easy bruising.  Cardiovascular:   Patient denies leg swelling and chest pains.  Respiratory:   Patient denies cough and shortness of breath.  Endocrine:   Patient denies excessive thirst.  Musculoskeletal:   Patient denies back pain and joint pain.  Neurological:   Patient denies headaches and dizziness.  Psychologic:   Patient denies depression and anxiety.   VITAL SIGNS:      09/27/2016 03:08 PM  Weight 170 lb / 77.11 kg  Height 67 in / 170.18 cm  BP 144/97 mmHg  Pulse 90 /min  BMI 26.6 kg/m   MULTI-SYSTEM PHYSICAL EXAMINATION:    Constitutional: Well-nourished. No physical deformities. Normally developed. Good grooming.  Neck: Neck symmetrical, not swollen. Normal tracheal position.  Respiratory: No labored breathing, no use of accessory muscles. Clear to auscultation  Cardiovascular: Normal temperature,  normal extremity pulses, no swelling, no varicosities. Regular rate and rhythm  Lymphatic: No enlargement of neck, axillae, groin.  Skin: No paleness, no jaundice, no cyanosis. No lesion, no ulcer, no rash.  Neurologic / Psychiatric: Oriented to time, oriented to place, oriented to person. No depression, no anxiety, no agitation.  Gastrointestinal: No mass, no tenderness, no rigidity, non obese abdomen.   Eyes: Normal conjunctivae. Normal eyelids.  Ears, Nose, Mouth, and Throat: Left ear no scars, no lesions, no masses. Right ear no scars, no lesions, no masses. Nose no scars, no lesions, no masses. Normal hearing. Normal lips.  Musculoskeletal: Normal gait and station of head and neck.     PAST DATA REVIEWED:  Source Of History:  Patient  Records Review:   Previous Patient Records   PROCEDURES:         KUB - 74000  A single view of the abdomen is obtained. Renal shadows are easily visualized bilaterally. There are no stones appreciated within the expected location in either renal pelvis. There are no additional calcifications along the expected location of either ureter bilaterally.  Gas pattern is grossly normal. No significant bony abnormalities.      There is a 4.853.2 mm stone in the proximal right ureter around the L3/L4 level consistent with a UPJ stone. This stone was not present on the patient's CT scan in November 2016.         Urinalysis w/Scope Dipstick Dipstick Cont'd Micro  Color: Yellow Bilirubin: Neg WBC/hpf: 0 - 5/hpf  Appearance: Clear Ketones: Neg RBC/hpf: 0 - 2/hpf  Specific Gravity: 1.025 Blood: 2+ Bacteria: Rare (0-9/hpf)  pH: 5.5 Protein: Neg Cystals: NS (Not Seen)  Glucose: Neg Urobilinogen: 0.2 Casts: NS (Not Seen)    Nitrites: Neg Trichomonas: Not Present    Leukocyte Esterase: Neg Mucous: Present      Epithelial Cells: NS (Not Seen)      Yeast: NS (Not Seen)      Sperm: Not Present    ASSESSMENT:      ICD-10 Details  1 GU:   Flank Pain - R10.84   2   Calculus Ureter - N20.1    PLAN:            Medications New Meds: Oxycodone Hcl 5 mg tablet 1-2 tablet PO Q 6 H   #20  0 Refill(s)            Orders X-Rays: KUB  X-Ray Notes: 09/28/16          Schedule         Document Letter(s):  Created for Patient: Clinical Summary    We discussed management options including medical expulsion therapy, shockwave lithotripsy, and ureteroscopy.  Ultimately, the patient has opted for shock wave lithotripsy. I discussed with the patient the procedure in detail as well as the risk and benefits. The patient is aware that she may need additional procedures. She also is aware of the risks of hematoma and pain. We will try to get this patient's scheduled as soon as possible.

## 2016-09-30 NOTE — Discharge Instructions (Signed)
Moderate Conscious Sedation, Adult, Care After °These instructions provide you with information about caring for yourself after your procedure. Your health care provider may also give you more specific instructions. Your treatment has been planned according to current medical practices, but problems sometimes occur. Call your health care provider if you have any problems or questions after your procedure. °What can I expect after the procedure? °After your procedure, it is common: °· To feel sleepy for several hours. °· To feel clumsy and have poor balance for several hours. °· To have poor judgment for several hours. °· To vomit if you eat too soon. °Follow these instructions at home: °For at least 24 hours after the procedure:  ° °· Do not: °¨ Participate in activities where you could fall or become injured. °¨ Drive. °¨ Use heavy machinery. °¨ Drink alcohol. °¨ Take sleeping pills or medicines that cause drowsiness. °¨ Make important decisions or sign legal documents. °¨ Take care of children on your own. °· Rest. °Eating and drinking  °· Follow the diet recommended by your health care provider. °· If you vomit: °¨ Drink water, juice, or soup when you can drink without vomiting. °¨ Make sure you have little or no nausea before eating solid foods. °General instructions  °· Have a responsible adult stay with you until you are awake and alert. °· Take over-the-counter and prescription medicines only as told by your health care provider. °· If you smoke, do not smoke without supervision. °· Keep all follow-up visits as told by your health care provider. This is important. °Contact a health care provider if: °· You keep feeling nauseous or you keep vomiting. °· You feel light-headed. °· You develop a rash. °· You have a fever. °Get help right away if: °· You have trouble breathing. °This information is not intended to replace advice given to you by your health care provider. Make sure you discuss any questions you  have with your health care provider. °Document Released: 08/08/2013 Document Revised: 03/22/2016 Document Reviewed: 02/07/2016 °Elsevier Interactive Patient Education © 2017 [employer] ° ° ° °See [facility] discharge instructions in chart. °

## 2016-10-14 DIAGNOSIS — N201 Calculus of ureter: Secondary | ICD-10-CM | POA: Diagnosis not present

## 2016-10-18 DIAGNOSIS — M5416 Radiculopathy, lumbar region: Secondary | ICD-10-CM | POA: Diagnosis not present

## 2016-10-18 DIAGNOSIS — M545 Low back pain: Secondary | ICD-10-CM | POA: Diagnosis not present

## 2016-10-22 ENCOUNTER — Other Ambulatory Visit: Payer: Self-pay | Admitting: Orthopedic Surgery

## 2016-10-22 DIAGNOSIS — M5416 Radiculopathy, lumbar region: Secondary | ICD-10-CM | POA: Diagnosis not present

## 2016-10-26 ENCOUNTER — Encounter (HOSPITAL_COMMUNITY): Payer: Self-pay | Admitting: *Deleted

## 2016-10-27 ENCOUNTER — Ambulatory Visit (HOSPITAL_COMMUNITY): Payer: BLUE CROSS/BLUE SHIELD | Admitting: Anesthesiology

## 2016-10-27 ENCOUNTER — Ambulatory Visit (HOSPITAL_COMMUNITY): Payer: BLUE CROSS/BLUE SHIELD

## 2016-10-27 ENCOUNTER — Inpatient Hospital Stay (HOSPITAL_COMMUNITY)
Admission: RE | Admit: 2016-10-27 | Discharge: 2016-10-28 | DRG: 519 | Disposition: A | Discharge status: Home / Self Care | Payer: BLUE CROSS/BLUE SHIELD | Source: Ambulatory Visit | Attending: Orthopedic Surgery | Admitting: Orthopedic Surgery

## 2016-10-27 ENCOUNTER — Encounter (HOSPITAL_COMMUNITY): Payer: Self-pay | Admitting: Anesthesiology

## 2016-10-27 ENCOUNTER — Encounter (HOSPITAL_COMMUNITY)
Admission: RE | Disposition: A | Discharge status: Home / Self Care | Payer: Self-pay | Source: Ambulatory Visit | Attending: Orthopedic Surgery

## 2016-10-27 DIAGNOSIS — M5117 Intervertebral disc disorders with radiculopathy, lumbosacral region: Principal | ICD-10-CM | POA: Diagnosis present

## 2016-10-27 DIAGNOSIS — L989 Disorder of the skin and subcutaneous tissue, unspecified: Secondary | ICD-10-CM | POA: Diagnosis not present

## 2016-10-27 DIAGNOSIS — Z419 Encounter for procedure for purposes other than remedying health state, unspecified: Secondary | ICD-10-CM

## 2016-10-27 DIAGNOSIS — E785 Hyperlipidemia, unspecified: Secondary | ICD-10-CM | POA: Diagnosis not present

## 2016-10-27 DIAGNOSIS — M5410 Radiculopathy, site unspecified: Secondary | ICD-10-CM | POA: Diagnosis present

## 2016-10-27 DIAGNOSIS — Z885 Allergy status to narcotic agent status: Secondary | ICD-10-CM

## 2016-10-27 DIAGNOSIS — G9611 Dural tear: Secondary | ICD-10-CM | POA: Diagnosis not present

## 2016-10-27 DIAGNOSIS — M5127 Other intervertebral disc displacement, lumbosacral region: Secondary | ICD-10-CM | POA: Diagnosis not present

## 2016-10-27 DIAGNOSIS — Z87891 Personal history of nicotine dependence: Secondary | ICD-10-CM | POA: Diagnosis not present

## 2016-10-27 DIAGNOSIS — M62830 Muscle spasm of back: Secondary | ICD-10-CM | POA: Diagnosis not present

## 2016-10-27 DIAGNOSIS — Z01818 Encounter for other preprocedural examination: Secondary | ICD-10-CM

## 2016-10-27 DIAGNOSIS — M5416 Radiculopathy, lumbar region: Secondary | ICD-10-CM | POA: Diagnosis not present

## 2016-10-27 DIAGNOSIS — F319 Bipolar disorder, unspecified: Secondary | ICD-10-CM | POA: Diagnosis not present

## 2016-10-27 DIAGNOSIS — D171 Benign lipomatous neoplasm of skin and subcutaneous tissue of trunk: Secondary | ICD-10-CM | POA: Diagnosis not present

## 2016-10-27 DIAGNOSIS — M5417 Radiculopathy, lumbosacral region: Secondary | ICD-10-CM | POA: Diagnosis not present

## 2016-10-27 HISTORY — PX: LUMBAR LAMINECTOMY/DECOMPRESSION MICRODISCECTOMY: SHX5026

## 2016-10-27 LAB — PROTIME-INR
INR: 0.97
PROTHROMBIN TIME: 12.9 s (ref 11.4–15.2)

## 2016-10-27 LAB — CBC WITH DIFFERENTIAL/PLATELET
Basophils Absolute: 0 10*3/uL (ref 0.0–0.1)
Basophils Relative: 0 %
EOS ABS: 0.1 10*3/uL (ref 0.0–0.7)
EOS PCT: 1 %
HCT: 46.3 % (ref 39.0–52.0)
Hemoglobin: 16.6 g/dL (ref 13.0–17.0)
LYMPHS ABS: 2 10*3/uL (ref 0.7–4.0)
Lymphocytes Relative: 28 %
MCH: 30.6 pg (ref 26.0–34.0)
MCHC: 35.9 g/dL (ref 30.0–36.0)
MCV: 85.4 fL (ref 78.0–100.0)
MONO ABS: 0.4 10*3/uL (ref 0.1–1.0)
MONOS PCT: 6 %
Neutro Abs: 4.7 10*3/uL (ref 1.7–7.7)
Neutrophils Relative %: 65 %
PLATELETS: 199 10*3/uL (ref 150–400)
RBC: 5.42 MIL/uL (ref 4.22–5.81)
RDW: 12.2 % (ref 11.5–15.5)
WBC: 7.1 10*3/uL (ref 4.0–10.5)

## 2016-10-27 LAB — COMPREHENSIVE METABOLIC PANEL
ALT: 25 U/L (ref 17–63)
ANION GAP: 6 (ref 5–15)
AST: 22 U/L (ref 15–41)
Albumin: 4.3 g/dL (ref 3.5–5.0)
Alkaline Phosphatase: 66 U/L (ref 38–126)
BUN: 10 mg/dL (ref 6–20)
CALCIUM: 9.5 mg/dL (ref 8.9–10.3)
CO2: 28 mmol/L (ref 22–32)
CREATININE: 0.93 mg/dL (ref 0.61–1.24)
Chloride: 105 mmol/L (ref 101–111)
Glucose, Bld: 88 mg/dL (ref 65–99)
Potassium: 3.7 mmol/L (ref 3.5–5.1)
SODIUM: 139 mmol/L (ref 135–145)
Total Bilirubin: 2 mg/dL — ABNORMAL HIGH (ref 0.3–1.2)
Total Protein: 7.1 g/dL (ref 6.5–8.1)

## 2016-10-27 LAB — URINALYSIS, ROUTINE W REFLEX MICROSCOPIC
Bilirubin Urine: NEGATIVE
GLUCOSE, UA: NEGATIVE mg/dL
HGB URINE DIPSTICK: NEGATIVE
KETONES UR: NEGATIVE mg/dL
Leukocytes, UA: NEGATIVE
Nitrite: NEGATIVE
PROTEIN: NEGATIVE mg/dL
Specific Gravity, Urine: 1.014 (ref 1.005–1.030)
pH: 7 (ref 5.0–8.0)

## 2016-10-27 LAB — APTT: aPTT: 30 seconds (ref 24–36)

## 2016-10-27 SURGERY — LUMBAR LAMINECTOMY/DECOMPRESSION MICRODISCECTOMY 1 LEVEL
Anesthesia: General | Laterality: Right

## 2016-10-27 MED ORDER — DIAZEPAM 5 MG PO TABS
5.0000 mg | ORAL_TABLET | Freq: Four times a day (QID) | ORAL | Status: DC | PRN
  Administered 2016-10-27 – 2016-10-28 (×3): 5 mg via ORAL
  Filled 2016-10-27 (×2): qty 1

## 2016-10-27 MED ORDER — THROMBIN 20000 UNITS EX SOLR
CUTANEOUS | Status: AC
  Filled 2016-10-27: qty 20000

## 2016-10-27 MED ORDER — FENTANYL CITRATE (PF) 100 MCG/2ML IJ SOLN
INTRAMUSCULAR | Status: DC | PRN
  Administered 2016-10-27 (×4): 50 ug via INTRAVENOUS

## 2016-10-27 MED ORDER — MORPHINE SULFATE (PF) 2 MG/ML IV SOLN
1.0000 mg | INTRAVENOUS | Status: DC | PRN
  Administered 2016-10-27 (×2): 4 mg via INTRAVENOUS
  Filled 2016-10-27: qty 2

## 2016-10-27 MED ORDER — PROPOFOL 10 MG/ML IV BOLUS
INTRAVENOUS | Status: AC
  Filled 2016-10-27: qty 20

## 2016-10-27 MED ORDER — ACETAMINOPHEN 650 MG RE SUPP: 650.0000 mg | RECTAL | Status: DC | PRN

## 2016-10-27 MED ORDER — BUPIVACAINE LIPOSOME 1.3 % IJ SUSP
INTRAMUSCULAR | Status: DC | PRN
  Administered 2016-10-27: 10 mL

## 2016-10-27 MED ORDER — LIDOCAINE 2% (20 MG/ML) 5 ML SYRINGE
INTRAMUSCULAR | Status: AC
  Filled 2016-10-27: qty 5

## 2016-10-27 MED ORDER — ONDANSETRON HCL 4 MG/2ML IJ SOLN
INTRAMUSCULAR | Status: AC
  Filled 2016-10-27: qty 2

## 2016-10-27 MED ORDER — PHENYLEPHRINE HCL 10 MG/ML IJ SOLN
INTRAMUSCULAR | Status: DC | PRN
  Administered 2016-10-27 (×3): 80 ug via INTRAVENOUS

## 2016-10-27 MED ORDER — SODIUM CHLORIDE 0.9% FLUSH: 3.0000 mL | INTRAVENOUS | Status: DC | PRN

## 2016-10-27 MED ORDER — FLEET ENEMA 7-19 GM/118ML RE ENEM
1.0000 | ENEMA | Freq: Once | RECTAL | Status: DC | PRN
Start: 2016-10-27 — End: 2016-10-28

## 2016-10-27 MED ORDER — CEFAZOLIN SODIUM-DEXTROSE 2-4 GM/100ML-% IV SOLN: 2.0000 g | INTRAVENOUS | Status: DC

## 2016-10-27 MED ORDER — MEPERIDINE HCL 25 MG/ML IJ SOLN: 6.2500 mg | INTRAMUSCULAR | Status: DC | PRN

## 2016-10-27 MED ORDER — FENTANYL CITRATE (PF) 100 MCG/2ML IJ SOLN
INTRAMUSCULAR | Status: AC
  Filled 2016-10-27: qty 4

## 2016-10-27 MED ORDER — MORPHINE SULFATE (PF) 2 MG/ML IV SOLN
1.0000 mg | INTRAVENOUS | Status: DC | PRN
  Administered 2016-10-28: 2 mg via INTRAVENOUS
  Filled 2016-10-27 (×2): qty 1

## 2016-10-27 MED ORDER — INDIGOTINDISULFONATE SODIUM 8 MG/ML IJ SOLN
INTRAMUSCULAR | Status: DC | PRN
  Administered 2016-10-27: .02 mL via INTRAVENOUS

## 2016-10-27 MED ORDER — ROCURONIUM BROMIDE 100 MG/10ML IV SOLN
INTRAVENOUS | Status: DC | PRN
  Administered 2016-10-27: 50 mg via INTRAVENOUS
  Administered 2016-10-27 (×2): 20 mg via INTRAVENOUS
  Administered 2016-10-27: 10 mg via INTRAVENOUS

## 2016-10-27 MED ORDER — 0.9 % SODIUM CHLORIDE (POUR BTL) OPTIME
TOPICAL | Status: DC | PRN
  Administered 2016-10-27: 1000 mL

## 2016-10-27 MED ORDER — CEFAZOLIN SODIUM-DEXTROSE 2-4 GM/100ML-% IV SOLN
INTRAVENOUS | Status: AC
  Filled 2016-10-27: qty 100

## 2016-10-27 MED ORDER — SODIUM CHLORIDE 0.9 % IV SOLN: 250.0000 mL | INTRAVENOUS | Status: DC

## 2016-10-27 MED ORDER — HEMOSTATIC AGENTS (NO CHARGE) OPTIME
TOPICAL | Status: DC | PRN
  Administered 2016-10-27 (×2): 1 via TOPICAL

## 2016-10-27 MED ORDER — ALUM & MAG HYDROXIDE-SIMETH 200-200-20 MG/5ML PO SUSP: 30.0000 mL | Freq: Four times a day (QID) | ORAL | Status: DC | PRN

## 2016-10-27 MED ORDER — MIDAZOLAM HCL 2 MG/2ML IJ SOLN
INTRAMUSCULAR | Status: AC
  Filled 2016-10-27: qty 2

## 2016-10-27 MED ORDER — POVIDONE-IODINE 7.5 % EX SOLN: Freq: Once | CUTANEOUS | Status: DC

## 2016-10-27 MED ORDER — METHYLENE BLUE 0.5 % INJ SOLN
INTRAVENOUS | Status: AC
  Filled 2016-10-27: qty 10

## 2016-10-27 MED ORDER — HYDROMORPHONE HCL 1 MG/ML IJ SOLN
0.2500 mg | INTRAMUSCULAR | Status: DC | PRN
  Administered 2016-10-27 (×4): 0.5 mg via INTRAVENOUS

## 2016-10-27 MED ORDER — OXYCODONE-ACETAMINOPHEN 5-325 MG PO TABS
1.0000 | ORAL_TABLET | ORAL | Status: DC | PRN
  Administered 2016-10-27 (×2): 2 via ORAL
  Filled 2016-10-27: qty 2

## 2016-10-27 MED ORDER — PHENOL 1.4 % MT LIQD: 1.0000 | OROMUCOSAL | Status: DC | PRN

## 2016-10-27 MED ORDER — SODIUM CHLORIDE 0.9 % IV SOLN
INTRAVENOUS | Status: DC
  Administered 2016-10-27: 20:00:00 via INTRAVENOUS

## 2016-10-27 MED ORDER — DIPHENHYDRAMINE HCL 25 MG PO CAPS: 25.0000 mg | ORAL_CAPSULE | Freq: Four times a day (QID) | ORAL | 0 refills | Status: DC | PRN

## 2016-10-27 MED ORDER — MORPHINE SULFATE (PF) 4 MG/ML IV SOLN
INTRAVENOUS | Status: AC
  Filled 2016-10-27: qty 1

## 2016-10-27 MED ORDER — SODIUM CHLORIDE 0.9% FLUSH
3.0000 mL | Freq: Two times a day (BID) | INTRAVENOUS | Status: DC
  Administered 2016-10-28: 3 mL via INTRAVENOUS

## 2016-10-27 MED ORDER — BISACODYL 5 MG PO TBEC: 5.0000 mg | DELAYED_RELEASE_TABLET | Freq: Every day | ORAL | Status: DC | PRN

## 2016-10-27 MED ORDER — MIDAZOLAM HCL 5 MG/5ML IJ SOLN
INTRAMUSCULAR | Status: DC | PRN
  Administered 2016-10-27: 2 mg via INTRAVENOUS

## 2016-10-27 MED ORDER — BUPIVACAINE-EPINEPHRINE (PF) 0.25% -1:200000 IJ SOLN
INTRAMUSCULAR | Status: AC
  Filled 2016-10-27: qty 30

## 2016-10-27 MED ORDER — DIAZEPAM 5 MG PO TABS
ORAL_TABLET | ORAL | Status: AC
  Administered 2016-10-27: 5 mg via ORAL
  Filled 2016-10-27: qty 1

## 2016-10-27 MED ORDER — VORTIOXETINE HBR 20 MG PO TABS
20.0000 mg | ORAL_TABLET | Freq: Every day | ORAL | Status: DC
  Administered 2016-10-27: 20 mg via ORAL
  Filled 2016-10-27 (×2): qty 20

## 2016-10-27 MED ORDER — BUPIVACAINE LIPOSOME 1.3 % IJ SUSP
20.0000 mL | INTRAMUSCULAR | Status: DC
  Filled 2016-10-27: qty 20

## 2016-10-27 MED ORDER — DOCUSATE SODIUM 100 MG PO CAPS
100.0000 mg | ORAL_CAPSULE | Freq: Two times a day (BID) | ORAL | Status: DC
  Administered 2016-10-27 – 2016-10-28 (×2): 100 mg via ORAL
  Filled 2016-10-27 (×2): qty 1

## 2016-10-27 MED ORDER — DIPHENHYDRAMINE HCL 25 MG PO CAPS
25.0000 mg | ORAL_CAPSULE | Freq: Four times a day (QID) | ORAL | Status: DC | PRN
  Administered 2016-10-27 – 2016-10-28 (×2): 50 mg via ORAL
  Filled 2016-10-27 (×3): qty 2

## 2016-10-27 MED ORDER — CEFAZOLIN IN D5W 1 GM/50ML IV SOLN
1.0000 g | Freq: Three times a day (TID) | INTRAVENOUS | Status: AC
  Administered 2016-10-27 – 2016-10-28 (×2): 1 g via INTRAVENOUS
  Filled 2016-10-27 (×2): qty 50

## 2016-10-27 MED ORDER — ONDANSETRON HCL 4 MG/2ML IJ SOLN
INTRAMUSCULAR | Status: DC | PRN
  Administered 2016-10-27: 4 mg via INTRAVENOUS

## 2016-10-27 MED ORDER — PROMETHAZINE HCL 25 MG/ML IJ SOLN: 6.2500 mg | INTRAMUSCULAR | Status: DC | PRN

## 2016-10-27 MED ORDER — OXYCODONE-ACETAMINOPHEN 5-325 MG PO TABS
ORAL_TABLET | ORAL | Status: AC
  Administered 2016-10-27: 2 via ORAL
  Filled 2016-10-27: qty 2

## 2016-10-27 MED ORDER — PROPOFOL 10 MG/ML IV BOLUS
INTRAVENOUS | Status: DC | PRN
  Administered 2016-10-27: 200 mg via INTRAVENOUS

## 2016-10-27 MED ORDER — DEXAMETHASONE SODIUM PHOSPHATE 10 MG/ML IJ SOLN
INTRAMUSCULAR | Status: AC
  Filled 2016-10-27: qty 1

## 2016-10-27 MED ORDER — PHENYLEPHRINE HCL 10 MG/ML IJ SOLN
INTRAMUSCULAR | Status: DC | PRN
  Administered 2016-10-27: 25 ug/min via INTRAVENOUS

## 2016-10-27 MED ORDER — ACETAMINOPHEN 325 MG PO TABS
650.0000 mg | ORAL_TABLET | Freq: Three times a day (TID) | ORAL | Status: DC
  Administered 2016-10-28 (×2): 650 mg via ORAL
  Filled 2016-10-27 (×2): qty 2

## 2016-10-27 MED ORDER — ACETAMINOPHEN 325 MG PO TABS: 650.0000 mg | ORAL_TABLET | ORAL | Status: DC | PRN

## 2016-10-27 MED ORDER — HYDROMORPHONE HCL 2 MG/ML IJ SOLN
INTRAMUSCULAR | Status: AC
Start: 2016-10-27 — End: 2016-10-28
  Filled 2016-10-27: qty 1

## 2016-10-27 MED ORDER — SENNOSIDES-DOCUSATE SODIUM 8.6-50 MG PO TABS: 1.0000 | ORAL_TABLET | Freq: Every evening | ORAL | Status: DC | PRN

## 2016-10-27 MED ORDER — SUGAMMADEX SODIUM 200 MG/2ML IV SOLN
INTRAVENOUS | Status: DC | PRN
  Administered 2016-10-27: 200 mg via INTRAVENOUS

## 2016-10-27 MED ORDER — THROMBIN 20000 UNITS EX SOLR
CUTANEOUS | Status: DC | PRN
  Administered 2016-10-27: 20 mL via TOPICAL

## 2016-10-27 MED ORDER — BUPIVACAINE-EPINEPHRINE 0.25% -1:200000 IJ SOLN
INTRAMUSCULAR | Status: DC | PRN
  Administered 2016-10-27: 17 mL

## 2016-10-27 MED ORDER — OXYCODONE HCL 5 MG PO TABS
10.0000 mg | ORAL_TABLET | ORAL | Status: DC | PRN
  Administered 2016-10-28: 15 mg via ORAL
  Administered 2016-10-28: 10 mg via ORAL
  Administered 2016-10-28: 15 mg via ORAL
  Filled 2016-10-27 (×2): qty 3
  Filled 2016-10-27: qty 2

## 2016-10-27 MED ORDER — ZOLPIDEM TARTRATE 5 MG PO TABS: 5.0000 mg | ORAL_TABLET | Freq: Every evening | ORAL | Status: DC | PRN

## 2016-10-27 MED ORDER — METHYLPREDNISOLONE ACETATE 40 MG/ML IJ SUSP
INTRAMUSCULAR | Status: AC
  Filled 2016-10-27: qty 1

## 2016-10-27 MED ORDER — LACTATED RINGERS IV SOLN
INTRAVENOUS | Status: DC
  Administered 2016-10-27 (×2): via INTRAVENOUS

## 2016-10-27 MED ORDER — ONDANSETRON HCL 4 MG/2ML IJ SOLN: 4.0000 mg | INTRAMUSCULAR | Status: DC | PRN

## 2016-10-27 MED ORDER — LIDOCAINE HCL (CARDIAC) 20 MG/ML IV SOLN
INTRAVENOUS | Status: DC | PRN
  Administered 2016-10-27: 100 mg via INTRAVENOUS

## 2016-10-27 MED ORDER — MENTHOL 3 MG MT LOZG: 1.0000 | LOZENGE | OROMUCOSAL | Status: DC | PRN

## 2016-10-27 MED ORDER — CEFAZOLIN SODIUM-DEXTROSE 2-3 GM-% IV SOLR
INTRAVENOUS | Status: DC | PRN
  Administered 2016-10-27: 2 g via INTRAVENOUS

## 2016-10-27 SURGICAL SUPPLY — 74 items
BENZOIN TINCTURE PRP APPL 2/3 (GAUZE/BANDAGES/DRESSINGS) ×2 IMPLANT
BUR ROUND PRECISION 4.0 (BURR) ×2 IMPLANT
CANISTER SUCTION 2500CC (MISCELLANEOUS) ×2 IMPLANT
CARTRIDGE OIL MAESTRO DRILL (MISCELLANEOUS) ×1 IMPLANT
CATH FOLEY 2WAY SLVR 30CC 16FR (CATHETERS) ×2 IMPLANT
CORDS BIPOLAR (ELECTRODE) ×2 IMPLANT
COVER SURGICAL LIGHT HANDLE (MISCELLANEOUS) ×2 IMPLANT
DIFFUSER DRILL AIR PNEUMATIC (MISCELLANEOUS) ×2 IMPLANT
DRAIN CHANNEL 15F RND FF W/TCR (WOUND CARE) IMPLANT
DRAPE POUCH INSTRU U-SHP 10X18 (DRAPES) ×4 IMPLANT
DRAPE SURG 17X23 STRL (DRAPES) ×8 IMPLANT
DURAPREP 26ML APPLICATOR (WOUND CARE) ×2 IMPLANT
DURASEAL SPINE SEALANT 3ML (MISCELLANEOUS) ×2 IMPLANT
ELECT BLADE 4.0 EZ CLEAN MEGAD (MISCELLANEOUS)
ELECT CAUTERY BLADE 6.4 (BLADE) ×2 IMPLANT
ELECT REM PT RETURN 9FT ADLT (ELECTROSURGICAL) ×2
ELECTRODE BLDE 4.0 EZ CLN MEGD (MISCELLANEOUS) IMPLANT
ELECTRODE REM PT RTRN 9FT ADLT (ELECTROSURGICAL) ×1 IMPLANT
EVACUATOR SILICONE 100CC (DRAIN) IMPLANT
FILTER STRAW FLUID ASPIR (MISCELLANEOUS) ×2 IMPLANT
GAUZE SPONGE 4X4 12PLY STRL (GAUZE/BANDAGES/DRESSINGS) ×2 IMPLANT
GAUZE SPONGE 4X4 16PLY XRAY LF (GAUZE/BANDAGES/DRESSINGS) ×4 IMPLANT
GLOVE BIO SURGEON STRL SZ7 (GLOVE) ×4 IMPLANT
GLOVE BIO SURGEON STRL SZ8 (GLOVE) ×10 IMPLANT
GLOVE BIOGEL PI IND STRL 7.0 (GLOVE) ×2 IMPLANT
GLOVE BIOGEL PI IND STRL 7.5 (GLOVE) ×1 IMPLANT
GLOVE BIOGEL PI IND STRL 8 (GLOVE) ×2 IMPLANT
GLOVE BIOGEL PI INDICATOR 7.0 (GLOVE) ×2
GLOVE BIOGEL PI INDICATOR 7.5 (GLOVE) ×1
GLOVE BIOGEL PI INDICATOR 8 (GLOVE) ×2
GOWN STRL REUS W/ TWL LRG LVL3 (GOWN DISPOSABLE) ×3 IMPLANT
GOWN STRL REUS W/ TWL XL LVL3 (GOWN DISPOSABLE) ×2 IMPLANT
GOWN STRL REUS W/TWL LRG LVL3 (GOWN DISPOSABLE) ×3
GOWN STRL REUS W/TWL XL LVL3 (GOWN DISPOSABLE) ×2
IV CATH 14GX2 1/4 (CATHETERS) ×2 IMPLANT
KIT BASIN OR (CUSTOM PROCEDURE TRAY) ×2 IMPLANT
KIT POSITION SURG JACKSON T1 (MISCELLANEOUS) ×2 IMPLANT
KIT ROOM TURNOVER OR (KITS) ×2 IMPLANT
NEEDLE 18GX1X1/2 (RX/OR ONLY) (NEEDLE) ×2 IMPLANT
NEEDLE 22X1 1/2 (OR ONLY) (NEEDLE) ×4 IMPLANT
NEEDLE HYPO 25GX1X1/2 BEV (NEEDLE) ×2 IMPLANT
NEEDLE SPNL 18GX3.5 QUINCKE PK (NEEDLE) ×4 IMPLANT
NS IRRIG 1000ML POUR BTL (IV SOLUTION) ×2 IMPLANT
OIL CARTRIDGE MAESTRO DRILL (MISCELLANEOUS) ×2
PACK LAMINECTOMY ORTHO (CUSTOM PROCEDURE TRAY) ×2 IMPLANT
PACK UNIVERSAL I (CUSTOM PROCEDURE TRAY) ×2 IMPLANT
PAD ARMBOARD 7.5X6 YLW CONV (MISCELLANEOUS) ×4 IMPLANT
PATTIES SURGICAL .5 X.5 (GAUZE/BANDAGES/DRESSINGS) ×2 IMPLANT
PATTIES SURGICAL .5 X1 (DISPOSABLE) ×2 IMPLANT
SPONGE INTESTINAL PEANUT (DISPOSABLE) ×2 IMPLANT
SPONGE SURGIFOAM ABS GEL 100 (HEMOSTASIS) ×2 IMPLANT
SPONGE SURGIFOAM ABS GEL SZ50 (HEMOSTASIS) ×2 IMPLANT
STRIP CLOSURE SKIN 1/2X4 (GAUZE/BANDAGES/DRESSINGS) ×2 IMPLANT
SURGIFLO TRUKIT (HEMOSTASIS) ×2 IMPLANT
SURGIFLO W/THROMBIN 8M KIT (HEMOSTASIS) IMPLANT
SUT MNCRL AB 4-0 PS2 18 (SUTURE) ×4 IMPLANT
SUT NURALON 4 0 TR CR/8 (SUTURE) ×2 IMPLANT
SUT VIC AB 0 CT1 18XCR BRD 8 (SUTURE) ×1 IMPLANT
SUT VIC AB 0 CT1 27 (SUTURE)
SUT VIC AB 0 CT1 27XBRD ANBCTR (SUTURE) IMPLANT
SUT VIC AB 0 CT1 8-18 (SUTURE) ×1
SUT VIC AB 1 CT1 18XCR BRD 8 (SUTURE) ×1 IMPLANT
SUT VIC AB 1 CT1 8-18 (SUTURE) ×1
SUT VIC AB 2-0 CT2 18 VCP726D (SUTURE) ×4 IMPLANT
SYR 20CC LL (SYRINGE) ×4 IMPLANT
SYR BULB IRRIGATION 50ML (SYRINGE) ×2 IMPLANT
SYR CONTROL 10ML LL (SYRINGE) ×4 IMPLANT
SYR TB 1ML 26GX3/8 SAFETY (SYRINGE) ×4 IMPLANT
SYR TB 1ML LUER SLIP (SYRINGE) ×4 IMPLANT
TAPE CLOTH SURG 4X10 WHT LF (GAUZE/BANDAGES/DRESSINGS) ×2 IMPLANT
TOWEL OR 17X24 6PK STRL BLUE (TOWEL DISPOSABLE) ×4 IMPLANT
TOWEL OR 17X26 10 PK STRL BLUE (TOWEL DISPOSABLE) ×2 IMPLANT
WATER STERILE IRR 1000ML POUR (IV SOLUTION) IMPLANT
YANKAUER SUCT BULB TIP NO VENT (SUCTIONS) ×2 IMPLANT

## 2016-10-27 NOTE — Anesthesia Preprocedure Evaluation (Signed)
Anesthesia Evaluation  Patient identified by MRN, date of birth, ID band Patient awake    Reviewed: Allergy & Precautions, NPO status , Patient's Chart, lab work & pertinent test results  History of Anesthesia Complications (+) Family history of anesthesia reaction  Airway Mallampati: II  TM Distance: >3 FB Neck ROM: Full    Dental no notable dental hx.    Pulmonary former smoker,    Pulmonary exam normal breath sounds clear to auscultation       Cardiovascular negative cardio ROS Normal cardiovascular exam Rhythm:Regular Rate:Normal     Neuro/Psych PSYCHIATRIC DISORDERS Anxiety Depression Bipolar Disorder negative neurological ROS     GI/Hepatic negative GI ROS, Neg liver ROS,   Endo/Other  negative endocrine ROS  Renal/GU negative Renal ROS     Musculoskeletal negative musculoskeletal ROS (+)   Abdominal   Peds  Hematology negative hematology ROS (+)   Anesthesia Other Findings   Reproductive/Obstetrics                             Anesthesia Physical Anesthesia Plan  ASA: II  Anesthesia Plan: General   Post-op Pain Management:    Induction: Intravenous  Airway Management Planned: Oral ETT  Additional Equipment:   Intra-op Plan:   Post-operative Plan: Extubation in OR  Informed Consent: I have reviewed the patients History and Physical, chart, labs and discussed the procedure including the risks, benefits and alternatives for the proposed anesthesia with the patient or authorized representative who has indicated his/her understanding and acceptance.   Dental advisory given  Plan Discussed with: CRNA  Anesthesia Plan Comments:         Anesthesia Quick Evaluation

## 2016-10-27 NOTE — H&P (Signed)
     PREOPERATIVE H&P  Chief Complaint: Right leg pain  HPI: Kyle Welch is a 40 y.o. male who presents with ongoing pain in the right leg  MRI reveals a moderate to large HNP on the right at L5/S1  Patient has failed multiple forms of conservative care and continues to have pain (see office notes for additional details regarding the patient's full course of treatment)  Past Medical History:  Diagnosis Date  . ANXIETY 04/17/2008  . Bipolar affective ([place]) 09/08/2011  . DEPRESSION 04/17/2008  . Family history of adverse reaction to anesthesia    mother had nausea  . H/O alcohol abuse   . History of kidney stones    November 2017  . Hyperlipidemia 09/08/2011  . INGUINAL PAIN, RIGHT 06/06/2008  . SKIN LESION 04/17/2008   Past Surgical History:  Procedure Laterality Date  . CONGENITAL URETER MALFORMATION REPAIR  1994  . HERNIA REPAIR     Left   Social History   Social History  . Marital status: Married    Spouse name: N/A  . Number of children: N/A  . Years of education: N/A   Social History Main Topics  . Smoking status: Former Smoker    Years: 10.00    Quit date: 09/30/2009  . Smokeless tobacco: Never Used  . Alcohol use Yes     Comment: none sine 12/2007, rare  . Drug use: No  . Sexual activity: Not on file   Other Topics Concern  . Not on file   Social History Narrative  . No narrative on file   Family History  Problem Relation Age of Onset  . Alcohol abuse Maternal Uncle     several  . Coronary artery disease Maternal Grandfather   . Alcohol abuse Other    Allergies  Allergen Reactions  . Codeine Itching   Prior to Admission medications   Medication Sig Start Date End Date Taking? Authorizing Provider  acetaminophen (TYLENOL) 500 MG tablet Take 1,000 mg by mouth every 6 (six) hours as needed for moderate pain or headache.   Yes Historical Provider, MD  ibuprofen (ADVIL,MOTRIN) 200 MG tablet Take 400-600 mg by mouth every 6 (six) hours as needed for  headache or moderate pain.   Yes Historical Provider, MD  meloxicam (MOBIC) 15 MG tablet Take 1 tablet (15 mg total) by mouth daily. Patient taking differently: Take 15 mg by mouth daily as needed for pain.  09/26/13  Yes Harden Mo, MD  vortioxetine HBr (TRINTELLIX) 20 MG TABS Take 20 mg by mouth daily.    Yes Historical Provider, MD     All other systems have been reviewed and were otherwise negative with the exception of those mentioned in the HPI and as above.  Physical Exam: There were no vitals filed for this visit.  General: Alert, no acute distress Cardiovascular: No pedal edema Respiratory: No cyanosis, no use of accessory musculature Skin: No lesions in the area of chief complaint Neurologic: Sensation intact distally Psychiatric: Patient is competent for consent with normal mood and affect Lymphatic: No axillary or cervical lymphadenopathy  MUSCULOSKELETAL: + SLR on the right  Assessment/Plan: Right lumbar radiculopathy Plan for Procedure(s): RIGHT SIDED LUMBAR 5-SACRUM 1 MICRODISCETOMY   Sinclair Ship, MD 10/27/2016 7:55 AM

## 2016-10-27 NOTE — Anesthesia Procedure Notes (Signed)
Procedure Name: Intubation Date/Time: 10/27/2016 12:31 PM Performed by: Willeen Cass P Pre-anesthesia Checklist: Patient identified, Emergency Drugs available, Suction available and Patient being monitored Patient Re-evaluated:Patient Re-evaluated prior to inductionOxygen Delivery Method: Circle System Utilized Preoxygenation: Pre-oxygenation with 100% oxygen Intubation Type: IV induction Ventilation: Mask ventilation without difficulty Laryngoscope Size: Mac and 3 Grade View: Grade III Tube type: Oral Tube size: 7.5 mm Number of attempts: 1 Airway Equipment and Method: Stylet and Oral airway Placement Confirmation: ETT inserted through vocal cords under direct vision,  positive ETCO2 and breath sounds checked- equal and bilateral Secured at: 23 cm Tube secured with: Tape Dental Injury: Teeth and Oropharynx as per pre-operative assessment

## 2016-10-27 NOTE — Anesthesia Postprocedure Evaluation (Signed)
Anesthesia Post Note  Patient: Kyle Welch  Procedure(s) Performed: Procedure(s) (LRB): RIGHT SIDED LUMBAR 5-SACRUM 1 MICRODISCETOMY (Right)  Patient location during evaluation: PACU Anesthesia Type: General Level of consciousness: awake and alert Pain management: pain level controlled Vital Signs Assessment: post-procedure vital signs reviewed and stable Respiratory status: spontaneous breathing, nonlabored ventilation, respiratory function stable and patient connected to nasal cannula oxygen Cardiovascular status: blood pressure returned to baseline and stable Postop Assessment: no signs of nausea or vomiting Anesthetic complications: no       Last Vitals:  Vitals:   10/27/16 1800 10/27/16 1847  BP:  129/82  Pulse: 94 96  Resp: 11 16  Temp:  37.4 C    Last Pain:  Vitals:   10/27/16 1847  TempSrc: Oral  PainSc:                  Kamryn Messineo DAVID

## 2016-10-27 NOTE — Transfer of Care (Signed)
Immediate Anesthesia Transfer of Care Note  Patient: Kyle Welch  Procedure(s) Performed: Procedure(s) with comments: RIGHT SIDED LUMBAR 5-SACRUM 1 MICRODISCETOMY (Right) - RIGHT SIDED LUMBAR 5-SACRUM 1 MICRODISCETOMY  Patient Location: PACU  Anesthesia Type:General  Level of Consciousness: awake, alert  and oriented  Airway & Oxygen Therapy: Patient Spontanous Breathing and Patient connected to nasal cannula oxygen  Post-op Assessment: Report given to RN and Post -op Vital signs reviewed and stable  Post vital signs: Reviewed and stable  Last Vitals:  Vitals:   10/27/16 1008  BP: (!) 117/93  Pulse: 73  Resp: 18  Temp: 37 C    Last Pain:  Vitals:   10/27/16 1036  TempSrc:   PainSc: 3       Patients Stated Pain Goal: 2 ([id number])  Complications: No apparent anesthesia complications

## 2016-10-28 ENCOUNTER — Encounter (HOSPITAL_COMMUNITY): Payer: Self-pay | Admitting: Orthopedic Surgery

## 2016-10-28 MED ORDER — METHOCARBAMOL 500 MG PO TABS
500.0000 mg | ORAL_TABLET | Freq: Four times a day (QID) | ORAL | Status: DC | PRN
  Administered 2016-10-28: 500 mg via ORAL
  Filled 2016-10-28: qty 1

## 2016-10-28 MED ORDER — METHOCARBAMOL 500 MG PO TABS: 500.0000 mg | ORAL_TABLET | Freq: Four times a day (QID) | ORAL | 1 refills | Status: DC | PRN

## 2016-10-28 NOTE — Progress Notes (Signed)
    Patient doing well Denies R leg pain No headaches  Has been supine + LBP, improved with Percocet, IV morphine not helpful per patient   Physical Exam: Vitals:   10/28/16 0112 10/28/16 0610  BP: 115/63 121/75  Pulse: 96 85  Resp: 18 18  Temp: 98.4 F (36.9 C) 98.5 F (36.9 C)    Dressing in place NVI  POD #1 s/p right L5/S1 decompression, doing well     - elevate HOB 30 degrees and monitor, and will progress and tolerated - Percocet for pain, Valium for muscle spasms - will recheck at 10AM, and will elevate HBO to 60 degrees if no headaches - will remove foley this afternoon

## 2016-10-28 NOTE — Progress Notes (Signed)
I spoke with patient's nurse. He has been ambulating without difficulty and without headaches. He will be discharged home with follow-up in appox. 2 weeks. I will touch base with him tomorrow to continue to monitor his progress.

## 2016-10-28 NOTE — Progress Notes (Signed)
Patient's foley catheter was removed; patient tolerated well. Patient also ambulated to the chair. Patient reports 8/10 pain after moving to chair. Patient was medicated. Will continue to monitor.

## 2016-10-28 NOTE — Care Management Note (Signed)
Case Management Note  Patient Details  Name: Kyle Welch MRN: [id number] Date of Birth: 12-04-1975  Subjective/Objective:                    Action/Plan: Pt discharging home with self care and his wife. No further needs per CM.   Expected Discharge Date:  10/31/16               Expected Discharge Plan:  Home/Self Care  In-House Referral:     Discharge planning Services     Post Acute Care Choice:    Choice offered to:     DME Arranged:    DME Agency:     HH Arranged:    HH Agency:     Status of Service:  Completed, signed off  If discussed at [employer] of Stay Meetings, dates discussed:    Additional Comments:  Pollie Friar, RN 10/28/2016, 3:12 PM

## 2016-10-28 NOTE — Care Management Note (Signed)
Case Management Note  Patient Details  Name: Kyle Welch MRN: [id number] Date of Birth: 11/26/1975  Subjective/Objective:   Pt underwent:   right L5/S1 decompression with small durotomy.                 Action/Plan: Pt on bedrest but HOB being elevated. CM following for d/c needs.   Expected Discharge Date:  10/31/16               Expected Discharge Plan:  Home/Self Care  In-House Referral:     Discharge planning Services     Post Acute Care Choice:    Choice offered to:     DME Arranged:    DME Agency:     HH Arranged:    HH Agency:     Status of Service:  In process, will continue to follow  If discussed at Long Length of Stay Meetings, dates discussed:    Additional Comments:  Pollie Friar, RN 10/28/2016, 11:29 AM

## 2016-10-28 NOTE — Progress Notes (Signed)
Patient discharge home with spouse. Discharge instructions were reviewed with patient, and spouse. Patient verbalized understanding.

## 2016-10-28 NOTE — Progress Notes (Signed)
Pt doing well, HOB is approximately 75-80deg and tolerating w/o difficulty, can progress to 90deg/sitting upright and sitting edge of bed. Will re-eval in approx 1hour and discuss potential ambulation if continues to tolerate. OK to pull foley catheter at this venture. Pt reports improvement in LBP/muscle spasm compared to this am.  Pricilla Holm, PA-C

## 2016-10-28 NOTE — Op Note (Signed)
NAMEDAMIAN, PROVANCE NO.:  [id number]  MEDICAL RECORD NO.:  [id number]  LOCATION:                                 FACILITY:  PHYSICIAN:  Phylliss Bob, MD      DATE OF BIRTH:  21-May-1976  DATE OF PROCEDURE:  10/27/2016                              OPERATIVE REPORT   PREOPERATIVE DIAGNOSES: 1. Very large right-sided L5-S1 disk herniation next. 2. Right S1 radiculopathy.  POSTOPERATIVE DIAGNOSES: 1. Very large right-sided L5-S1 disk herniation next. 2. Right S1 radiculopathy.  PROCEDURE: 1. Right-sided L5-S1 laminotomy with partial facetectomy and removal     of large herniated right-sided L5-S1 disk herniation. 2. Durotomy repair x1.  SURGEON:  Phylliss Bob, MD  ASSISTANT:  Pricilla Holm, PA-C.  ANESTHESIA:  General endotracheal anesthesia.  COMPLICATIONS:  Small durotomy x1, uneventfully repaired with a 4-0 Nurolon stitch.  DISPOSITION:  Stable.  ESTIMATED BLOOD LOSS:  Minimal.  INDICATIONS FOR SURGERY:  Briefly, Mr. Sharpsteen is a pleasant 40 year old male who did present to me with severe pain in the right leg.  We did initially discuss proceeding with surgery 6 months prior.  The patient, however, did wish to not proceed with surgical intervention.  He then re- presented in December with ongoing and substantial pain.  At that point in time, his pain had been present for approximately 4 years.  An updated MRI did reveal a very large right-sided L5-S1 disk herniation. Of note, the patient did have transitional anatomy and the level of the herniation has been referred to as the L5-S1 level.  Given the patient's ongoing pain and lack of improvement, we did discuss proceeding with surgical intervention.  The patient was fully aware of the risks and limitations of surgery including the possibility of recurrent herniation and the possibility of durotomy.  He did wish to proceed.  DESCRIPTION OF PROCEDURE:  On October 27, 2016, the patient was  brought to surgery and general endotracheal anesthesia was administered.  The patient was placed prone and a well-padded flat Jackson bed with a Wilson frame.  Antibiotics were given and a time-out procedure was performed.  The back was prepped and draped in the usual fashion.  A midline incision was then made overlying the L5-S1 intervertebral space. The fascia was incised in a curvilinear fashion just off the midline on the right side.  The paraspinal musculature was bluntly swept laterally. A self-retaining retractor was placed and a lateral intraoperative radiograph did confirm the appropriate operative level.  I then proceeded with a laminotomy, removing the inferior and medial aspect of L5.  A partial facetectomy was performed as well.  The ligamentum flavum was identified and removed.  The traversing right S1 nerve was identified and medially retracted.  There was noted to be a very large prominent disk protrusion identified immediately ventral to the right S1 nerve.  I did use a nerve hook to tease away the superficial layers overlying the intervertebral disk herniation.  I then placed downward pressure with a Penfield 4, and multiple large fragments did readily declare themselves.  These fragments were removed uneventfully using a series of pituitaries.  By doing so, I was able to thoroughly  and completely decompress the traversing right S1 nerve.  There were minor areas of epidural bleeding, controlled using Surgiflo as well as bipolar electrocautery.  Once the medial retraction of the nerve was released, there was noted to be extravasation of cerebrospinal fluid.  The location of this was dorsally just to the right of the midline at the L5- S1 intervertebral space.  Specifically, there was a very small 1 mm durotomy encountered.  Extravasation of cerebrospinal fluid was noted to a minimal degree.  This was uneventfully repaired using a running 4-0 Nurolon stitch.  I did place  gelfoam over the durotomy repair as well. At the termination of the repair, there was no active extravasation of cerebrospinal fluid noted.  The wound was then copiously irrigated.  The fascia was then closed using #1 Vicryl.  The subcutaneous layer was closed using 2-0 Vicryl and the skin was closed using 4-0 Monocryl. Benzoin and Steri-Strips were applied followed by sterile dressing.  All instrument counts were correct at the termination of the procedure.  Of note, Pricilla Holm was my assistant throughout surgery, and did aid in retraction, suctioning, and closure from start to finish.     Phylliss Bob, MD   ______________________________ Phylliss Bob, MD    MD/MEDQ  D:  10/27/2016  T:  10/28/2016  Job:  [id number]

## 2016-11-10 NOTE — Discharge Summary (Signed)
Patient ID: Kyle Welch MRN: [id number] DOB/AGE: Mar 04, 1976 41 y.o.  Admit date: 10/27/2016 Discharge date: 10/28/2016  Admission Diagnoses:  Active Problems:   Radiculopathy   Discharge Diagnoses:  Same  Past Medical History:  Diagnosis Date  . ANXIETY 04/17/2008  . Bipolar affective ([place]) 09/08/2011  . DEPRESSION 04/17/2008  . Family history of adverse reaction to anesthesia    mother had nausea  . H/O alcohol abuse   . History of kidney stones    November 2017  . Hyperlipidemia 09/08/2011  . INGUINAL PAIN, RIGHT 06/06/2008  . SKIN LESION 04/17/2008    Surgeries: Procedure(s): RIGHT SIDED LUMBAR 5-SACRUM 1 MICRODISCETOMY on 10/27/2016   Consultants: None  Discharged Condition: Improved  Hospital Course: Kyle Welch is an 41 y.o. male who was admitted 10/27/2016 for operative treatment of radiculopathy. Patient has severe unremitting pain that affects sleep, daily activities, and work/hobbies. After pre-op clearance the patient was taken to the operating room on 10/27/2016 and underwent  Procedure(s): RIGHT SIDED LUMBAR 5-SACRUM 1 MICRODISCETOMY.    Patient was given perioperative antibiotics:  Anti-infectives    Start     Dose/Rate Route Frequency Ordered Stop   10/27/16 1900  ceFAZolin (ANCEF) IVPB 1 g/50 mL premix     1 g 100 mL/hr over 30 Minutes Intravenous Every 8 hours 10/27/16 1854 10/28/16 0348   10/27/16 1009  ceFAZolin (ANCEF) 2-4 GM/100ML-% IVPB    Comments:  Kyle Welch   : cabinet override      10/27/16 1009 10/27/16 2214   10/27/16 1006  ceFAZolin (ANCEF) IVPB 2g/100 mL premix  Status:  Discontinued     2 g 200 mL/hr over 30 Minutes Intravenous On call to O.R. 10/27/16 1006 10/27/16 1847       Patient was given sequential compression devices, early ambulation to prevent DVT.  Patient benefited maximally from hospital stay and there was small complication of encountered CSF leak and small dural repair.    Recent vital signs: BP 112/70  (BP Location: Right Arm)   Pulse 100   Temp 99.3 F (37.4 C) (Oral)   Resp 19   Ht 5' 7.5" (1.715 m)   Wt 79.4 kg (175 lb)   SpO2 98%   BMI 27.00 kg/m   Discharge Medications:   Allergies as of 10/28/2016      Reactions   Codeine Itching      Medication List    STOP taking these medications   ibuprofen 200 MG tablet Commonly known as:  ADVIL,MOTRIN   meloxicam 15 MG tablet Commonly known as:  MOBIC     TAKE these medications   acetaminophen 500 MG tablet Commonly known as:  TYLENOL Take 1,000 mg by mouth every 6 (six) hours as needed for moderate pain or headache.   diphenhydrAMINE 25 mg capsule Commonly known as:  BENADRYL Take 1-2 capsules (25-50 mg total) by mouth every 6 (six) hours as needed for itching (Codeine allergy dose with percocet).   methocarbamol 500 MG tablet Commonly known as:  ROBAXIN Take 1 tablet (500 mg total) by mouth every 6 (six) hours as needed for muscle spasms.   TRINTELLIX 20 MG Tabs Generic drug:  vortioxetine HBr Take 20 mg by mouth daily.       Diagnostic Studies: Dg Chest 2 View  Result Date: 10/27/2016 CLINICAL DATA:  Preoperative evaluation for lumbar discectomy EXAM: CHEST  2 VIEW COMPARISON:  None. FINDINGS: Lungs are clear. The heart size and pulmonary vascularity are normal. No adenopathy. No  bone lesions. IMPRESSION: No edema or consolidation. Electronically Signed   By: Lowella Grip III M.D.   On: 10/27/2016 10:31   Dg Lumbar Spine 2-3 Views  Result Date: 10/27/2016 CLINICAL DATA:  Left laminectomy EXAM: LUMBAR SPINE - 2-3 VIEW COMPARISON:  MR lumbar spine of 10/19/2016 FINDINGS: Using the same numbering system as has been used on the MRI of the lumbar spine studies, there is partial lumbarization of S1 with the lowest disc space being that of S1-S2. Therefore on cross-table image 1, there are needles positioned beneath the spinous processes posteriorly of L4 and S1. On the second image there is an instrument directed  toward the L5-S1 interspace. IMPRESSION: Localization of L5-S1 interspace on the last image. Electronically Signed   By: Ivar Drape M.D.   On: 10/27/2016 15:10    Disposition: 01-Home or Self Care   POD #1 s/p right L5/S1 decompression, with encountered CSF leak and small dural repair, doing well     -Written scripts for pain signed and in chart -D/C instructions sheet printed and in chart -D/C today  -F/U in office 2 weeks   Signed: Justice Britain 11/10/2016, 11:06 AM

## 2017-05-26 DIAGNOSIS — H6123 Impacted cerumen, bilateral: Secondary | ICD-10-CM | POA: Diagnosis not present

## 2017-05-26 DIAGNOSIS — J029 Acute pharyngitis, unspecified: Secondary | ICD-10-CM | POA: Diagnosis not present

## 2017-05-26 DIAGNOSIS — J069 Acute upper respiratory infection, unspecified: Secondary | ICD-10-CM | POA: Diagnosis not present

## 2017-07-28 DIAGNOSIS — Z0000 Encounter for general adult medical examination without abnormal findings: Secondary | ICD-10-CM | POA: Diagnosis not present

## 2017-11-02 DIAGNOSIS — G4752 REM sleep behavior disorder: Secondary | ICD-10-CM | POA: Diagnosis not present

## 2017-11-02 DIAGNOSIS — E781 Pure hyperglyceridemia: Secondary | ICD-10-CM | POA: Diagnosis not present

## 2017-11-02 DIAGNOSIS — F909 Attention-deficit hyperactivity disorder, unspecified type: Secondary | ICD-10-CM | POA: Diagnosis not present

## 2017-11-02 DIAGNOSIS — Z0000 Encounter for general adult medical examination without abnormal findings: Secondary | ICD-10-CM | POA: Diagnosis not present

## 2017-11-08 DIAGNOSIS — F4320 Adjustment disorder, unspecified: Secondary | ICD-10-CM | POA: Diagnosis not present

## 2017-12-01 DIAGNOSIS — F4320 Adjustment disorder, unspecified: Secondary | ICD-10-CM | POA: Diagnosis not present

## 2017-12-19 DIAGNOSIS — F4320 Adjustment disorder, unspecified: Secondary | ICD-10-CM | POA: Diagnosis not present

## 2018-01-02 DIAGNOSIS — F4320 Adjustment disorder, unspecified: Secondary | ICD-10-CM | POA: Diagnosis not present

## 2018-01-02 DIAGNOSIS — F418 Other specified anxiety disorders: Secondary | ICD-10-CM | POA: Diagnosis not present

## 2018-01-16 DIAGNOSIS — F418 Other specified anxiety disorders: Secondary | ICD-10-CM | POA: Diagnosis not present

## 2018-01-23 DIAGNOSIS — F4320 Adjustment disorder, unspecified: Secondary | ICD-10-CM | POA: Diagnosis not present

## 2018-01-26 DIAGNOSIS — F418 Other specified anxiety disorders: Secondary | ICD-10-CM | POA: Diagnosis not present

## 2018-01-30 DIAGNOSIS — F341 Dysthymic disorder: Secondary | ICD-10-CM | POA: Diagnosis not present

## 2018-02-06 DIAGNOSIS — Z0000 Encounter for general adult medical examination without abnormal findings: Secondary | ICD-10-CM | POA: Diagnosis not present

## 2018-02-20 DIAGNOSIS — F4320 Adjustment disorder, unspecified: Secondary | ICD-10-CM | POA: Diagnosis not present

## 2018-02-21 DIAGNOSIS — F341 Dysthymic disorder: Secondary | ICD-10-CM | POA: Diagnosis not present

## 2018-02-28 DIAGNOSIS — F418 Other specified anxiety disorders: Secondary | ICD-10-CM | POA: Diagnosis not present

## 2018-05-11 DIAGNOSIS — F4320 Adjustment disorder, unspecified: Secondary | ICD-10-CM | POA: Diagnosis not present

## 2018-05-30 DIAGNOSIS — F4320 Adjustment disorder, unspecified: Secondary | ICD-10-CM | POA: Diagnosis not present

## 2018-07-27 DIAGNOSIS — F4320 Adjustment disorder, unspecified: Secondary | ICD-10-CM | POA: Diagnosis not present

## 2018-08-17 DIAGNOSIS — F4320 Adjustment disorder, unspecified: Secondary | ICD-10-CM | POA: Diagnosis not present

## 2018-09-13 DIAGNOSIS — J029 Acute pharyngitis, unspecified: Secondary | ICD-10-CM | POA: Diagnosis not present

## 2018-09-13 DIAGNOSIS — B9689 Other specified bacterial agents as the cause of diseases classified elsewhere: Secondary | ICD-10-CM | POA: Diagnosis not present

## 2018-09-13 DIAGNOSIS — J069 Acute upper respiratory infection, unspecified: Secondary | ICD-10-CM | POA: Diagnosis not present

## 2018-09-21 DIAGNOSIS — F4320 Adjustment disorder, unspecified: Secondary | ICD-10-CM | POA: Diagnosis not present

## 2018-10-03 DIAGNOSIS — F341 Dysthymic disorder: Secondary | ICD-10-CM | POA: Diagnosis not present

## 2018-10-05 DIAGNOSIS — F4320 Adjustment disorder, unspecified: Secondary | ICD-10-CM | POA: Diagnosis not present

## 2018-10-17 DIAGNOSIS — F4320 Adjustment disorder, unspecified: Secondary | ICD-10-CM | POA: Diagnosis not present

## 2018-10-30 DIAGNOSIS — B9689 Other specified bacterial agents as the cause of diseases classified elsewhere: Secondary | ICD-10-CM | POA: Diagnosis not present

## 2018-10-30 DIAGNOSIS — J069 Acute upper respiratory infection, unspecified: Secondary | ICD-10-CM | POA: Diagnosis not present

## 2018-11-02 DIAGNOSIS — Z1159 Encounter for screening for other viral diseases: Secondary | ICD-10-CM | POA: Diagnosis not present

## 2018-11-02 DIAGNOSIS — Z0000 Encounter for general adult medical examination without abnormal findings: Secondary | ICD-10-CM | POA: Diagnosis not present

## 2018-11-02 DIAGNOSIS — E781 Pure hyperglyceridemia: Secondary | ICD-10-CM | POA: Diagnosis not present

## 2018-11-06 DIAGNOSIS — F418 Other specified anxiety disorders: Secondary | ICD-10-CM | POA: Diagnosis not present

## 2018-11-06 DIAGNOSIS — Z0000 Encounter for general adult medical examination without abnormal findings: Secondary | ICD-10-CM | POA: Diagnosis not present

## 2018-11-06 DIAGNOSIS — F909 Attention-deficit hyperactivity disorder, unspecified type: Secondary | ICD-10-CM | POA: Diagnosis not present

## 2018-11-06 DIAGNOSIS — E781 Pure hyperglyceridemia: Secondary | ICD-10-CM | POA: Diagnosis not present

## 2018-11-09 DIAGNOSIS — F4322 Adjustment disorder with anxiety: Secondary | ICD-10-CM | POA: Diagnosis not present

## 2018-11-13 DIAGNOSIS — F341 Dysthymic disorder: Secondary | ICD-10-CM | POA: Diagnosis not present

## 2018-11-27 DIAGNOSIS — F4322 Adjustment disorder with anxiety: Secondary | ICD-10-CM | POA: Diagnosis not present

## 2018-12-14 DIAGNOSIS — F4322 Adjustment disorder with anxiety: Secondary | ICD-10-CM | POA: Diagnosis not present

## 2019-02-12 DIAGNOSIS — F4322 Adjustment disorder with anxiety: Secondary | ICD-10-CM | POA: Diagnosis not present

## 2019-02-19 DIAGNOSIS — F4322 Adjustment disorder with anxiety: Secondary | ICD-10-CM | POA: Diagnosis not present

## 2019-02-26 DIAGNOSIS — F4322 Adjustment disorder with anxiety: Secondary | ICD-10-CM | POA: Diagnosis not present

## 2019-03-05 DIAGNOSIS — F4322 Adjustment disorder with anxiety: Secondary | ICD-10-CM | POA: Diagnosis not present

## 2019-03-12 DIAGNOSIS — F4322 Adjustment disorder with anxiety: Secondary | ICD-10-CM | POA: Diagnosis not present

## 2019-03-19 DIAGNOSIS — F4322 Adjustment disorder with anxiety: Secondary | ICD-10-CM | POA: Diagnosis not present

## 2019-03-26 DIAGNOSIS — F4322 Adjustment disorder with anxiety: Secondary | ICD-10-CM | POA: Diagnosis not present

## 2019-04-02 ENCOUNTER — Other Ambulatory Visit (HOSPITAL_COMMUNITY): Payer: Self-pay | Admitting: Psychiatry

## 2019-04-02 DIAGNOSIS — F4322 Adjustment disorder with anxiety: Secondary | ICD-10-CM | POA: Diagnosis not present

## 2019-04-09 DIAGNOSIS — F4322 Adjustment disorder with anxiety: Secondary | ICD-10-CM | POA: Diagnosis not present

## 2019-04-18 DIAGNOSIS — T63481A Toxic effect of venom of other arthropod, accidental (unintentional), initial encounter: Secondary | ICD-10-CM | POA: Diagnosis not present

## 2019-04-18 DIAGNOSIS — H6123 Impacted cerumen, bilateral: Secondary | ICD-10-CM | POA: Diagnosis not present

## 2019-04-18 DIAGNOSIS — L03116 Cellulitis of left lower limb: Secondary | ICD-10-CM | POA: Diagnosis not present

## 2019-04-23 DIAGNOSIS — F4322 Adjustment disorder with anxiety: Secondary | ICD-10-CM | POA: Diagnosis not present

## 2019-04-30 DIAGNOSIS — F4322 Adjustment disorder with anxiety: Secondary | ICD-10-CM | POA: Diagnosis not present

## 2019-05-07 DIAGNOSIS — F4322 Adjustment disorder with anxiety: Secondary | ICD-10-CM | POA: Diagnosis not present

## 2019-05-14 DIAGNOSIS — F4322 Adjustment disorder with anxiety: Secondary | ICD-10-CM | POA: Diagnosis not present

## 2019-05-21 DIAGNOSIS — F4322 Adjustment disorder with anxiety: Secondary | ICD-10-CM | POA: Diagnosis not present

## 2019-05-28 DIAGNOSIS — F4322 Adjustment disorder with anxiety: Secondary | ICD-10-CM | POA: Diagnosis not present

## 2019-06-04 DIAGNOSIS — F4322 Adjustment disorder with anxiety: Secondary | ICD-10-CM | POA: Diagnosis not present

## 2019-06-11 DIAGNOSIS — F4322 Adjustment disorder with anxiety: Secondary | ICD-10-CM | POA: Diagnosis not present

## 2019-07-02 DIAGNOSIS — F4322 Adjustment disorder with anxiety: Secondary | ICD-10-CM | POA: Diagnosis not present

## 2019-07-17 DIAGNOSIS — F341 Dysthymic disorder: Secondary | ICD-10-CM | POA: Diagnosis not present

## 2019-07-23 DIAGNOSIS — F4322 Adjustment disorder with anxiety: Secondary | ICD-10-CM | POA: Diagnosis not present

## 2019-07-30 DIAGNOSIS — F4322 Adjustment disorder with anxiety: Secondary | ICD-10-CM | POA: Diagnosis not present

## 2019-08-06 DIAGNOSIS — F4322 Adjustment disorder with anxiety: Secondary | ICD-10-CM | POA: Diagnosis not present

## 2019-08-13 DIAGNOSIS — F4322 Adjustment disorder with anxiety: Secondary | ICD-10-CM | POA: Diagnosis not present

## 2019-08-20 DIAGNOSIS — F4322 Adjustment disorder with anxiety: Secondary | ICD-10-CM | POA: Diagnosis not present

## 2019-08-27 DIAGNOSIS — F4322 Adjustment disorder with anxiety: Secondary | ICD-10-CM | POA: Diagnosis not present

## 2019-09-10 DIAGNOSIS — F4322 Adjustment disorder with anxiety: Secondary | ICD-10-CM | POA: Diagnosis not present

## 2019-09-17 DIAGNOSIS — F4322 Adjustment disorder with anxiety: Secondary | ICD-10-CM | POA: Diagnosis not present

## 2019-09-24 DIAGNOSIS — F4322 Adjustment disorder with anxiety: Secondary | ICD-10-CM | POA: Diagnosis not present

## 2019-10-01 DIAGNOSIS — F4322 Adjustment disorder with anxiety: Secondary | ICD-10-CM | POA: Diagnosis not present

## 2019-10-08 DIAGNOSIS — F4322 Adjustment disorder with anxiety: Secondary | ICD-10-CM | POA: Diagnosis not present

## 2019-10-15 DIAGNOSIS — F4322 Adjustment disorder with anxiety: Secondary | ICD-10-CM | POA: Diagnosis not present

## 2019-10-22 DIAGNOSIS — F4322 Adjustment disorder with anxiety: Secondary | ICD-10-CM | POA: Diagnosis not present

## 2019-10-31 DIAGNOSIS — F341 Dysthymic disorder: Secondary | ICD-10-CM | POA: Diagnosis not present

## 2019-11-01 DIAGNOSIS — F411 Generalized anxiety disorder: Secondary | ICD-10-CM | POA: Diagnosis not present

## 2019-11-08 DIAGNOSIS — Z0000 Encounter for general adult medical examination without abnormal findings: Secondary | ICD-10-CM | POA: Diagnosis not present

## 2019-11-13 DIAGNOSIS — F411 Generalized anxiety disorder: Secondary | ICD-10-CM | POA: Diagnosis not present

## 2019-11-13 DIAGNOSIS — Z0000 Encounter for general adult medical examination without abnormal findings: Secondary | ICD-10-CM | POA: Diagnosis not present

## 2020-01-10 DIAGNOSIS — F411 Generalized anxiety disorder: Secondary | ICD-10-CM | POA: Diagnosis not present

## 2020-02-08 ENCOUNTER — Ambulatory Visit: Payer: Self-pay | Attending: Internal Medicine

## 2020-02-08 DIAGNOSIS — Z23 Encounter for immunization: Secondary | ICD-10-CM

## 2020-02-08 NOTE — Progress Notes (Signed)
   Covid-19 Vaccination Clinic  Name:  Kyle Welch    MRN: [id number] DOB: 1975-11-29  02/08/2020  [name] was observed post Covid-19 immunization for 15 minutes without incident. He was provided with Vaccine Information Sheet and instruction to access the V-Safe system.   [name] was instructed to call 911 with any severe reactions post vaccine: Marland Kitchen Difficulty breathing  . Swelling of face and throat  . A fast heartbeat  . A bad rash all over body  . Dizziness and weakness   Immunizations Administered    Name Date Dose VIS Date Route   Pfizer COVID-19 Vaccine 02/08/2020  9:10 AM 0.3 mL 10/12/2019 Intramuscular   Manufacturer: [employer], [employer]   Lot: [id number]   [place]: [id number]

## 2020-02-28 DIAGNOSIS — F411 Generalized anxiety disorder: Secondary | ICD-10-CM | POA: Diagnosis not present

## 2020-02-28 DIAGNOSIS — F339 Major depressive disorder, recurrent, unspecified: Secondary | ICD-10-CM | POA: Diagnosis not present

## 2020-03-04 ENCOUNTER — Ambulatory Visit: Payer: Self-pay | Attending: Internal Medicine

## 2020-03-04 DIAGNOSIS — Z23 Encounter for immunization: Secondary | ICD-10-CM

## 2020-03-04 NOTE — Progress Notes (Signed)
   Covid-19 Vaccination Clinic  Name:  Kyle Welch    MRN: [id number] DOB: 09/08/1976  03/04/2020  Mr. [name] was observed post Covid-19 immunization for 15 minutes without incident. He was provided with Vaccine Information Sheet and instruction to access the V-Safe system.   [name] was instructed to call 911 with any severe reactions post vaccine: Marland Kitchen Difficulty breathing  . Swelling of face and throat  . A fast heartbeat  . A bad rash all over body  . Dizziness and weakness   Immunizations Administered    Name Date Dose VIS Date Route   Pfizer COVID-19 Vaccine 03/04/2020  9:07 AM 0.3 mL 12/26/2018 Intramuscular   Manufacturer: [place]   Lot: [id number]   [place]: [id number]

## 2020-07-18 DIAGNOSIS — F339 Major depressive disorder, recurrent, unspecified: Secondary | ICD-10-CM | POA: Diagnosis not present

## 2020-07-18 DIAGNOSIS — F411 Generalized anxiety disorder: Secondary | ICD-10-CM | POA: Diagnosis not present

## 2020-11-13 DIAGNOSIS — E7800 Pure hypercholesterolemia, unspecified: Secondary | ICD-10-CM | POA: Diagnosis not present

## 2020-11-13 DIAGNOSIS — Z0000 Encounter for general adult medical examination without abnormal findings: Secondary | ICD-10-CM | POA: Diagnosis not present

## 2020-11-25 DIAGNOSIS — N183 Chronic kidney disease, stage 3 unspecified: Secondary | ICD-10-CM | POA: Diagnosis not present

## 2020-11-25 DIAGNOSIS — F339 Major depressive disorder, recurrent, unspecified: Secondary | ICD-10-CM | POA: Diagnosis not present

## 2020-11-25 DIAGNOSIS — N1831 Chronic kidney disease, stage 3a: Secondary | ICD-10-CM | POA: Diagnosis not present

## 2020-11-25 DIAGNOSIS — R030 Elevated blood-pressure reading, without diagnosis of hypertension: Secondary | ICD-10-CM | POA: Diagnosis not present

## 2020-11-25 DIAGNOSIS — N200 Calculus of kidney: Secondary | ICD-10-CM | POA: Diagnosis not present

## 2020-11-25 DIAGNOSIS — Z0000 Encounter for general adult medical examination without abnormal findings: Secondary | ICD-10-CM | POA: Diagnosis not present

## 2021-01-19 DIAGNOSIS — R030 Elevated blood-pressure reading, without diagnosis of hypertension: Secondary | ICD-10-CM | POA: Diagnosis not present

## 2021-01-19 DIAGNOSIS — J8410 Pulmonary fibrosis, unspecified: Secondary | ICD-10-CM | POA: Diagnosis not present

## 2021-01-21 DIAGNOSIS — Z117 Encounter for testing for latent tuberculosis infection: Secondary | ICD-10-CM | POA: Diagnosis not present

## 2021-01-30 ENCOUNTER — Ambulatory Visit (INDEPENDENT_AMBULATORY_CARE_PROVIDER_SITE_OTHER): Payer: BC Managed Care – PPO | Admitting: Pulmonary Disease

## 2021-01-30 ENCOUNTER — Other Ambulatory Visit: Payer: Self-pay

## 2021-01-30 ENCOUNTER — Encounter: Payer: Self-pay | Admitting: Pulmonary Disease

## 2021-01-30 VITALS — BP 140/84 | HR 71 | Temp 98.2°F | Ht 67.0 in | Wt 180.2 lb

## 2021-01-30 DIAGNOSIS — R0600 Dyspnea, unspecified: Secondary | ICD-10-CM

## 2021-01-30 DIAGNOSIS — Z87891 Personal history of nicotine dependence: Secondary | ICD-10-CM | POA: Diagnosis not present

## 2021-01-30 DIAGNOSIS — R911 Solitary pulmonary nodule: Secondary | ICD-10-CM | POA: Diagnosis not present

## 2021-01-30 DIAGNOSIS — R0609 Other forms of dyspnea: Secondary | ICD-10-CM

## 2021-01-30 DIAGNOSIS — J984 Other disorders of lung: Secondary | ICD-10-CM

## 2021-01-30 DIAGNOSIS — J8410 Pulmonary fibrosis, unspecified: Secondary | ICD-10-CM | POA: Diagnosis not present

## 2021-01-30 NOTE — Patient Instructions (Signed)
Thank you for visiting Dr. Valeta Harms at [facility]. Today we recommend the following:  Orders Placed This Encounter  Procedures  . CT Chest Wo Contrast   Return in about 1 year (around 01/30/2022), or if symptoms worsen or fail to improve, for with APP or Dr. Valeta Harms.    Please do your part to reduce the spread of COVID-19.

## 2021-01-30 NOTE — Progress Notes (Signed)
Synopsis: Referred in April 2022 for lung nodule by Deland Pretty, MD  Subjective:   PATIENT ID: Kyle Welch GENDER: male DOB: Jul 24, 1976, MRN: [id number]  Chief Complaint  Patient presents with  . Consult    Lung nodule found on CT scan.  Denies any issues.  Sometimes with a lot of activity he feels some [facility]    PMH no prior lung issues, former smoker 29 - 45 yo, at max 1 ppd, usually 0.5 ppd. 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 Married, 11 years, 6 and 8 years.  Patient grew up in the [place].  Born in [place] lived in [place] most of his life.  He briefly lived in [place] for a few years.  Overall no significant dust or chemical exposure history.  Patient had CT imaging of the chest for coronary calcium scoring.  This revealed a lower lobe calcified granuloma.  Here today for evaluation follow-up.    Past Medical History:  Diagnosis Date  . ANXIETY 04/17/2008  . Bipolar affective ([place]) 09/08/2011  . DEPRESSION 04/17/2008  . Family history of adverse reaction to anesthesia    mother had nausea  . H/O alcohol abuse   . History of kidney stones    November 2017  . Hyperlipidemia 09/08/2011  . INGUINAL PAIN, RIGHT 06/06/2008  . SKIN LESION 04/17/2008     Family History  Problem Relation Age of Onset  . Alcohol abuse Maternal Uncle        several  . Coronary artery disease Maternal Grandfather   . Alcohol abuse Other      Past Surgical History:  Procedure Laterality Date  . CONGENITAL URETER MALFORMATION REPAIR  1994  . HERNIA REPAIR     Left  . LUMBAR LAMINECTOMY/DECOMPRESSION MICRODISCECTOMY Right 10/27/2016   Procedure: RIGHT SIDED LUMBAR 5-SACRUM 1 MICRODISCETOMY;  Surgeon: Phylliss Bob, MD;  Location: [facility];  Service: Orthopedics;  Laterality: Right;  RIGHT SIDED LUMBAR 5-SACRUM 1 MICRODISCETOMY    Social History   Socioeconomic History  . Marital status: Married    Spouse name: Not on file  . Number of children: Not on file   . Years of education: Not on file  . Highest education level: Not on file  Occupational History  . Not on file  Tobacco Use  . Smoking status: Former Smoker    Years: 10.00    Quit date: 09/30/2009    Years since quitting: 11.3  . Smokeless tobacco: Never Used  Substance and Sexual Activity  . Alcohol use: Yes    Comment: none sine 12/2007, rare  . Drug use: No  . Sexual activity: Not on file  Other Topics Concern  . Not on file  Social History Narrative  . Not on file   Social Determinants of Health   Financial Resource Strain: Not on file  Food Insecurity: Not on file  Transportation Needs: Not on file  Physical Activity: Not on file  Stress: Not on file  Social Connections: Not on file  Intimate Partner Violence: Not on file     Allergies  Allergen Reactions  . Codeine Itching     Outpatient Medications Prior to Visit  Medication Sig Dispense Refill  . clonazePAM (KLONOPIN) 0.5 MG tablet Take 0.5 mg by mouth 2 (two) times daily.    Marland Kitchen FLUoxetine (PROZAC) 20 MG capsule Take 20 mg by mouth daily.    Marland Kitchen lamoTRIgine (LAMICTAL) 100 MG tablet Take 150 mg by mouth daily.    Marland Kitchen  acetaminophen (TYLENOL) 500 MG tablet Take 1,000 mg by mouth every 6 (six) hours as needed for moderate pain or headache. (Patient not taking: Reported on 01/30/2021)    . diphenhydrAMINE (BENADRYL) 25 mg capsule Take 1-2 capsules (25-50 mg total) by mouth every 6 (six) hours as needed for itching (Codeine allergy dose with percocet). (Patient not taking: Reported on 01/30/2021) 30 capsule 0  . methocarbamol (ROBAXIN) 500 MG tablet Take 1 tablet (500 mg total) by mouth every 6 (six) hours as needed for muscle spasms. (Patient not taking: Reported on 01/30/2021) 60 tablet 1  . vortioxetine HBr (TRINTELLIX) 20 MG TABS tablet Take 20 mg by mouth daily.  (Patient not taking: Reported on 01/30/2021)     No facility-administered medications prior to visit.    Review of Systems  Constitutional: Negative for chills,  fever, malaise/fatigue and weight loss.  HENT: Negative for hearing loss, sore throat and tinnitus.   Eyes: Negative for blurred vision and double vision.  Respiratory: Negative for cough, hemoptysis, sputum production, shortness of breath, wheezing and stridor.   Cardiovascular: Negative for chest pain, palpitations, orthopnea, leg swelling and PND.  Gastrointestinal: Negative for abdominal pain, constipation, diarrhea, heartburn, nausea and vomiting.  Genitourinary: Negative for dysuria, hematuria and urgency.  Musculoskeletal: Negative for joint pain and myalgias.  Skin: Negative for itching and rash.  Neurological: Negative for dizziness, tingling, weakness and headaches.  Endo/Heme/Allergies: Negative for environmental allergies. Does not bruise/bleed easily.  Psychiatric/Behavioral: Negative for depression. The patient is not nervous/anxious and does not have insomnia.   All other systems reviewed and are negative.    Objective:  Physical Exam Vitals reviewed.  Constitutional:      General: He is not in acute distress.    Appearance: He is well-developed.  HENT:     Head: Normocephalic and atraumatic.  Eyes:     General: No scleral icterus.    Conjunctiva/sclera: Conjunctivae normal.     Pupils: Pupils are equal, round, and reactive to light.  Neck:     Vascular: No JVD.     Trachea: No tracheal deviation.  Cardiovascular:     Rate and Rhythm: Normal rate and regular rhythm.     Heart sounds: Normal heart sounds. No murmur heard.   Pulmonary:     Effort: Pulmonary effort is normal. No tachypnea, accessory muscle usage or respiratory distress.     Breath sounds: Normal breath sounds. No stridor. No wheezing, rhonchi or rales.  Abdominal:     General: Bowel sounds are normal. There is no distension.     Palpations: Abdomen is soft.     Tenderness: There is no abdominal tenderness.  Musculoskeletal:        General: No tenderness.     Cervical back: Neck supple.   Lymphadenopathy:     Cervical: No cervical adenopathy.  Skin:    General: Skin is warm and dry.     Capillary Refill: Capillary refill takes less than 2 seconds.     Findings: No rash.  Neurological:     Mental Status: He is alert and oriented to person, place, and time.  Psychiatric:        Behavior: Behavior normal.      Vitals:   01/30/21 1511  BP: 140/84  Pulse: 71  Temp: 98.2 F (36.8 C)  TempSrc: Tympanic  SpO2: 97%  Weight: 180 lb 4 oz (81.8 kg)  Height: 5\' 7"  (1.702 m)   97% on RA BMI Readings from Last 3 Encounters:  01/30/21 28.23 kg/m  10/27/16 27.00 kg/m  09/30/16 26.73 kg/m   Wt Readings from Last 3 Encounters:  01/30/21 180 lb 4 oz (81.8 kg)  10/27/16 175 lb (79.4 kg)  09/30/16 173 lb 3.2 oz (78.6 kg)     CBC    Component Value Date/Time   WBC 7.1 10/27/2016 1039   RBC 5.42 10/27/2016 1039   HGB 16.6 10/27/2016 1039   HCT 46.3 10/27/2016 1039   PLT 199 10/27/2016 1039   MCV 85.4 10/27/2016 1039   MCH 30.6 10/27/2016 1039   MCHC 35.9 10/27/2016 1039   RDW 12.2 10/27/2016 1039   LYMPHSABS 2.0 10/27/2016 1039   MONOABS 0.4 10/27/2016 1039   EOSABS 0.1 10/27/2016 1039   BASOSABS 0.0 10/27/2016 1039    Chest Imaging: CXR 2017: No infiltrate  CT Calcium Scoring, [facility]  March 14th 2022  LLL calcified granuloma   Pulmonary Functions Testing Results: No flowsheet data found.  FeNO:   Pathology:   Echocardiogram:   Heart Catheterization:     Assessment & Plan:     ICD-10-CM   1. Calcified granuloma of lung ([place])  J84.10 CT Chest Wo Contrast  2. Former smoker  Z87.891   3. Lung nodule  R91.1 CT Chest Wo Contrast  4. DOE (dyspnea on exertion)  R06.00     Discussion:  45 year old male, former smoker calcified granuloma seen on previous coronary calcium CT imaging.  Patient does have some shortness of breath which is brought on by exertion.  He feels like this is more related to his physical endurance.  He has not been  very active but if he does try becomes short of breath on occasion.  Plan: With his prior history of smoking and small calcified granuloma we will recommend noncontrasted CT scan follow-up in 1 year. Patient was to make sure that there is no significant change or the development of new nodules with his smoking history. Patient can follow-up with [place] in 1 year after repeat CT chest. Recommended increasing his exercise tolerance and trying to get back into a regular physical activity routine.    Current Outpatient Medications:  .  clonazePAM (KLONOPIN) 0.5 MG tablet, Take 0.5 mg by mouth 2 (two) times daily., Disp: , Rfl:  .  FLUoxetine (PROZAC) 20 MG capsule, Take 20 mg by mouth daily., Disp: , Rfl:  .  lamoTRIgine (LAMICTAL) 100 MG tablet, Take 150 mg by mouth daily., Disp: , Rfl:    Garner Nash, DO [facility] 01/30/2021 3:19 PM

## 2021-02-20 DIAGNOSIS — J019 Acute sinusitis, unspecified: Secondary | ICD-10-CM | POA: Diagnosis not present

## 2021-02-20 DIAGNOSIS — J029 Acute pharyngitis, unspecified: Secondary | ICD-10-CM | POA: Diagnosis not present

## 2021-02-20 DIAGNOSIS — B9689 Other specified bacterial agents as the cause of diseases classified elsewhere: Secondary | ICD-10-CM | POA: Diagnosis not present

## 2021-06-23 DIAGNOSIS — H16291 Other keratoconjunctivitis, right eye: Secondary | ICD-10-CM | POA: Diagnosis not present

## 2021-09-22 DIAGNOSIS — J069 Acute upper respiratory infection, unspecified: Secondary | ICD-10-CM | POA: Diagnosis not present

## 2021-11-01 DIAGNOSIS — D751 Secondary polycythemia: Secondary | ICD-10-CM

## 2021-11-01 HISTORY — DX: Secondary polycythemia: D75.1

## 2021-11-26 DIAGNOSIS — Z0000 Encounter for general adult medical examination without abnormal findings: Secondary | ICD-10-CM | POA: Diagnosis not present

## 2021-11-30 DIAGNOSIS — R0683 Snoring: Secondary | ICD-10-CM | POA: Diagnosis not present

## 2021-11-30 DIAGNOSIS — R400 Somnolence: Secondary | ICD-10-CM | POA: Diagnosis not present

## 2021-11-30 DIAGNOSIS — F418 Other specified anxiety disorders: Secondary | ICD-10-CM | POA: Diagnosis not present

## 2021-11-30 DIAGNOSIS — E782 Mixed hyperlipidemia: Secondary | ICD-10-CM | POA: Diagnosis not present

## 2021-11-30 DIAGNOSIS — F5104 Psychophysiologic insomnia: Secondary | ICD-10-CM | POA: Diagnosis not present

## 2021-11-30 DIAGNOSIS — Z1212 Encounter for screening for malignant neoplasm of rectum: Secondary | ICD-10-CM | POA: Diagnosis not present

## 2021-11-30 DIAGNOSIS — R69 Illness, unspecified: Secondary | ICD-10-CM | POA: Diagnosis not present

## 2021-11-30 DIAGNOSIS — J8410 Pulmonary fibrosis, unspecified: Secondary | ICD-10-CM | POA: Diagnosis not present

## 2021-11-30 DIAGNOSIS — D751 Secondary polycythemia: Secondary | ICD-10-CM | POA: Diagnosis not present

## 2021-11-30 DIAGNOSIS — I10 Essential (primary) hypertension: Secondary | ICD-10-CM | POA: Diagnosis not present

## 2021-11-30 DIAGNOSIS — M25562 Pain in left knee: Secondary | ICD-10-CM | POA: Diagnosis not present

## 2021-11-30 DIAGNOSIS — Z0000 Encounter for general adult medical examination without abnormal findings: Secondary | ICD-10-CM | POA: Diagnosis not present

## 2021-12-02 NOTE — Progress Notes (Addendum)
Subjective:    CC: Low back and L knee pain  I, Molly Weber, LAT, ATC, am serving as scribe for Dr. Lynne Leader.  HPI: Pt is a 46 y/o male presenting w/ c/o low back and L knee pain.  Low back pain: Chronic w/ progressively worsening pain x 5-6 months.  Prior disectomy at [facility] x 5 years ago.  He locates his pain to the R-side of his lower back and erector spinae. -Radiating pain: no -LE numbness/tingling: no -Aggravating factors: increased activity; lifting/carrying things; repetitive lumbar flexion; laying prone -Treatments tried: heat; IBU  L knee pain: chronic, intermittent pain that has been worsening over the last 4-5 months.  Locates his pain to his L medial and lateral knee -Swelling: intermittently yes -Mechanical symptoms: occasionally yes -Aggravating factors: quick, rotational movements -Treatments tried: knee sleeve; heat; IBU  Diagnostic testing: L knee XR at [place] office- ?  Pertinent review of Systems: No fevers or chills  Relevant historical information: Bipolar disorder.  History lumbar radiculopathy   Objective:    Vitals:   12/03/21 0858  BP: (!) 148/88  Pulse: 95  SpO2: 98%   General: Well Developed, well nourished, and in no acute distress.   MSK:  L-spine: Nontender midline.  Normal lumbar motion pain with left lateral flexion and rotation. Lower extremity strength is intact. Negative slump test. Reflexes and sensation are intact.  Left knee normal-appearing Normal motion without crepitation. Mildly tender palpation medial joint line. Stable ligamentous exam. Positive medial McMurray's test. Intact strength.  Lab and Radiology Results  Diagnostic Limited MSK Ultrasound of: Left knee Quad tendon intact normal-appearing No significant joint effusion. Patellar tendon intact. Calcification present at distal patellar tendon insertion consistent in appearance with prior Osgood-Schlatter. Medial joint line narrowed  with slight degeneration of the medial meniscus present. Lateral joint line normal. Posterior knee no Baker's cyst. Impression: Concern for degenerative medial meniscus tear.  X-ray images L-spine obtained today personally and independently interpreted. X-ray images compared to MRI L-spine April 20, 2016. Mild DDD L5-S1.  Lumbarization of S1 again visualized. Await formal radiology review  Patient has had an x-ray of his left knee at his primary care provider's office.  However he has these on a CD and left them at home.  He will bring them by the clinic and I can read them.  Addendum 12/29/21 Patient provided a CD 2 view knee x-ray shows some calcifications at the patellar tendon insertion onto the tibia.  No acute fractures.  Mild degenerative changes present. Images personally and independently interpreted.   Impression and Recommendations:    Assessment and Plan: 46 y.o. male with  Chronic low back pain.  Patient had a microdiscectomy in 2017 at L5-S1.  He has had some chronic ongoing back pain since.  This point I think he benefit from trial of physical therapy.  Plan to refer to PT and check back in about 8 weeks.  If not improved consider repeat MRI for potential injection planning.  Left knee pain: Concern for degenerative medial meniscus tear.  Reviewing the x-ray images that he is already had would be helpful.  He will try to bring by those x-ray images in the near future.  The meantime we discussed some treatment options.  Plan for conservative management strategies including Voltaren gel and physical therapy focusing on quad strengthening. Recheck in about 8 weeks.   PDMP not reviewed this encounter. Orders Placed This Encounter  Procedures   DG Lumbar Spine Complete  Standing Status:   Future    Number of Occurrences:   1    Standing Expiration Date:   12/31/2021    Order Specific Question:   Reason for Exam (SYMPTOM  OR DIAGNOSIS REQUIRED)    Answer:   low back pain     Order Specific Question:   Preferred imaging location?    Answer:   [facility]   [place] LIMITED JOINT SPACE STRUCTURES LOW LEFT(NO LINKED CHARGES)    Order Specific Question:   Reason for Exam (SYMPTOM  OR DIAGNOSIS REQUIRED)    Answer:   L knee pain    Order Specific Question:   Preferred imaging location?    Answer:   [place]   Ambulatory referral to Physical Therapy    Referral Priority:   Routine    Referral Type:   Physical Medicine    Referral Reason:   Specialty Services Required    Requested Specialty:   Physical Therapy    Number of Visits Requested:   1   No orders of the defined types were placed in this encounter.   Discussed warning signs or symptoms. Please see discharge instructions. Patient expresses understanding.   The above documentation has been reviewed and is accurate and complete Lynne Leader, M.D.

## 2021-12-03 ENCOUNTER — Telehealth: Payer: Self-pay | Admitting: Nurse Practitioner

## 2021-12-03 ENCOUNTER — Other Ambulatory Visit: Payer: Self-pay

## 2021-12-03 ENCOUNTER — Ambulatory Visit (INDEPENDENT_AMBULATORY_CARE_PROVIDER_SITE_OTHER): Payer: 59

## 2021-12-03 ENCOUNTER — Ambulatory Visit: Payer: Self-pay

## 2021-12-03 ENCOUNTER — Ambulatory Visit: Payer: 59 | Admitting: Family Medicine

## 2021-12-03 ENCOUNTER — Encounter: Payer: Self-pay | Admitting: Family Medicine

## 2021-12-03 VITALS — BP 148/88 | HR 95 | Ht 67.0 in | Wt 174.8 lb

## 2021-12-03 DIAGNOSIS — G8929 Other chronic pain: Secondary | ICD-10-CM

## 2021-12-03 DIAGNOSIS — M545 Low back pain, unspecified: Secondary | ICD-10-CM | POA: Diagnosis not present

## 2021-12-03 DIAGNOSIS — M25562 Pain in left knee: Secondary | ICD-10-CM

## 2021-12-03 NOTE — Patient Instructions (Addendum)
Nice to meet you today.  I've referred you to Physical Therapy.  Their office will call you to schedule but please let us know if you haven't heard from them in one week regarding scheduling.  Please use Voltaren gel (Generic Diclofenac Gel) up to 4x daily for pain as needed.  This is available over-the-counter as both the name brand Voltaren gel and the generic diclofenac gel.   Please get an Xray today before you leave.  Follow-up: 8 weeks

## 2021-12-03 NOTE — Telephone Encounter (Signed)
Scheduled appt per 2/1 referral. Spoke to pt who is aware of appt date and time. Pt is aware to arrive 15 mins prior to appt time.

## 2021-12-07 NOTE — Progress Notes (Signed)
Lumbar spine x-ray has a similar appearance to your MRI from 2017 with a transitional vertebrae at S1.  Otherwise it looks normal.

## 2021-12-11 ENCOUNTER — Encounter: Payer: Self-pay | Admitting: Physical Therapy

## 2021-12-11 ENCOUNTER — Other Ambulatory Visit: Payer: Self-pay

## 2021-12-11 ENCOUNTER — Ambulatory Visit (INDEPENDENT_AMBULATORY_CARE_PROVIDER_SITE_OTHER): Payer: 59 | Admitting: Physical Therapy

## 2021-12-11 DIAGNOSIS — M545 Low back pain, unspecified: Secondary | ICD-10-CM | POA: Diagnosis not present

## 2021-12-11 DIAGNOSIS — M25562 Pain in left knee: Secondary | ICD-10-CM

## 2021-12-11 NOTE — Therapy (Signed)
OUTPATIENT PHYSICAL THERAPY EVALUATION   Patient Name: Kyle Welch MRN: [id number] DOB:1976/04/30, 46 y.o., male Today's Date: 12/11/2021   PT End of Session - 12/11/21 1102     Visit Number 1    Number of Visits 12    Date for PT Re-Evaluation 01/22/22    Authorization Type [employer]    PT Start Time 1102    PT Stop Time 1145    PT Time Calculation (min) 43 min    Activity Tolerance Patient tolerated treatment well    Behavior During Therapy [facility] for tasks assessed/performed             Past Medical History:  Diagnosis Date   ANXIETY 04/17/2008   Bipolar affective ([place]) 09/08/2011   DEPRESSION 04/17/2008   Family history of adverse reaction to anesthesia    mother had nausea   H/O alcohol abuse    History of kidney stones    November 2017   Hyperlipidemia 09/08/2011   INGUINAL PAIN, RIGHT 06/06/2008   SKIN LESION 04/17/2008   Past Surgical History:  Procedure Laterality Date   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   LUMBAR LAMINECTOMY/DECOMPRESSION MICRODISCECTOMY Right 10/27/2016   Procedure: RIGHT SIDED LUMBAR 5-SACRUM 1 MICRODISCETOMY;  Surgeon: Phylliss Bob, MD;  Location: [place];  Service: Orthopedics;  Laterality: Right;  RIGHT SIDED LUMBAR 5-SACRUM 1 MICRODISCETOMY   Patient Active Problem List   Diagnosis Date Noted   Radiculopathy 10/27/2016   Lipoma left lower chest wall 04/22/2014   Bipolar affective ([place]) 09/08/2011   Hyperlipidemia 09/08/2011   Preventative health care 09/03/2011   INGUINAL PAIN, RIGHT 06/06/2008   ANXIETY 04/17/2008   DEPRESSION 04/17/2008   SKIN LESION 04/17/2008    PCP: Deland Pretty, MD  REFERRING PROVIDER: Lynne Leader   REFERRING DIAG: Back pain, L knee pain  THERAPY DIAG:  Acute right-sided low back pain without sciatica  Acute pain of left knee  ONSET DATE:   SUBJECTIVE:                                                                                                                                                                                            SUBJECTIVE STATEMENT: L knee pain: for about 2 years, but worse in the last 2 months.  Medial knee pain, feels swollen at times, and popping laterally.  Has been a bit more active, coaching soccer. Most painful with initial standing and 1st thing in am.  Back pain: 5 years ago, discectomy on R, states good recovery from that.  Continues to feel pain in low R, sometimes into  glute.  Works from home, mostly on computer.  Likes seated fig 4 stretch.   Exercise: not at this time.   PERTINENT HISTORY:  none    PAIN:  Are you having pain? Yes NPRS scale:  5-6/10 Pain location: Knee  Pain orientation: Left  PAIN TYPE: aching Pain description: intermittent  Aggravating factors: increased activity Relieving factors: none  Are you having pain? Yes NPRS scale:  4/10 Pain location:  Back  Pain orientation: Right  PAIN TYPE: aching Pain description: intermittent  Aggravating factors: Sleeping prone, feels stiff in AM, increased bending, activity  Relieving factors: none stated    PRECAUTIONS: None  WEIGHT BEARING RESTRICTIONS No  FALLS:  Has patient fallen in last 6 months? No, Number of falls: 0  PLOF: Independent  PATIENT GOALS  Decreased pain in back and knee.    OBJECTIVE:   DIAGNOSTIC FINDINGS:  Recent US of L knee: Impression: Concern for degenerative medial meniscus tear.  COGNITION:  Overall cognitive status: Within functional limits for tasks assessed  PALPATION: Lumbar: tenderness in R low lumbar musculature, into SI, Tightness in R glute, but minimal pain with palpation  L knee: tenderness at medial joint line, and medial patella border, tightness in lateral quad.    AROM: 12/11/20: Lumbar: WFL/ non painful    Hips: mild tightness/limitation for ER bil L>R.  Knee: WNL bil;     LE MMT: 12/11/20:  Hips: 4+/5, Knee: 4+/5;    LUMBAR SPECIAL TESTS:  Lumbar: Neg SLR, Neg SI compression,  Knee:  minimal pain with mcmurray, + patella maltracking    TODAY'S TREATMENT   Therapeutic Exercise:  See below for HEP    PATIENT EDUCATION:  Education details: PT POC, Exam findings, HEP Person educated: Patient Education method: Explanation, Demonstration, Tactile cues, Verbal cues, and Handouts Education comprehension: verbalized understanding, returned demonstration, verbal cues required, tactile cues required, and needs further education   HOME EXERCISE PROGRAM: Access Code: [email or web address] URL: [email or web address] Date: 12/11/2021 Prepared by: Lyndee Hensen  Exercises Cat Cow - 2 x daily - 2 sets - 10 reps - 10 hold Supine Lower Trunk Rotation - 2 x daily - 10 reps - 5 hold Supine Figure 4 Piriformis Stretch - 2 x daily - 3 reps - 30 hold Supine Bridge - 1 x daily - 2 sets - 10 reps Supine March - 2 x daily - 2 sets - 10 reps Seated Knee Extension AROM - 1 x daily - 2 sets - 10 reps    ASSESSMENT:  CLINICAL IMPRESSION:  12/11/21:Pt presents with primary complaint of increased pain in low back and L knee. He has muscle tension and soreness in R low lumbar region, and pain into R glute at times. He has decreased core strength, and lack of effective Hep for his ongoing back pain, and did not have treatment or guidance after previous lumbar surgery. He also has increased pain in L knee, with possible medial meniscal involvement, as well as some patella femoral dysfunction. Pt to benefit from strengthening and education on HEP for knee and back pain. Pt to benefit from skilled PT to improve deficits and pain.   Objective impairments include decreased activity tolerance, decreased knowledge of use of DME, decreased mobility, decreased ROM, decreased strength, increased muscle spasms, impaired flexibility, improper body mechanics, and pain. These impairments are limiting patient from cleaning, community activity, driving, meal prep, occupation, laundry, and yard work. Personal  factors including Time since onset of injury/illness/exacerbation are also affecting patient's functional outcome. Patient  will benefit from skilled PT to address above impairments and improve overall function.  REHAB POTENTIAL: Good  CLINICAL DECISION MAKING: Stable/uncomplicated  EVALUATION COMPLEXITY: Low   GOALS: Goals reviewed with patient? Yes  SHORT TERM GOALS:  STG Name Target Date Goal status  1 Patient will be independent with initial HEP   12/25/21 INITIAL             LONG TERM GOALS:   LTG Name Target Date Goal status  1 Patient will be independent with final HEP for back and knee   01/22/22 INITIAL  2 Pt to report decreased pain in L knee and low back to 0-2/10 with activity   01/22/22 INITIAL  3 Pt to demonstrate improved strength of bil hips and quads to be 5/5, to improve stability and pain in L knee  01/22/22 INITIAL  4 Pt to demonstrate optimal mechanics and ability for bend, lift, squat without pain, to improve ability for IADLs.   01/22/22 INITIAL  5 Pt to report ability for activity and exercise for at least 30 min without increased pain in back or knee, to improve ability for community activities.    01/22/22 INITIAL     PLAN: PT FREQUENCY: 1-2x/week  PT DURATION: 6 weeks  PLANNED INTERVENTIONS: Therapeutic exercises, Therapeutic activity, Neuro Muscular re-education, Patient/Family education, Joint mobilization, Stair training, DME instructions, Dry Needling, Electrical stimulation, Spinal mobilization, Cryotherapy, Moist heat, Taping, Traction, Ultrasound, Ionotophoresis 4mg /ml Dexamethasone, and Manual therapy  PLAN FOR NEXT SESSION:    Lyndee Hensen, PT 12/11/2021, 11:49 AM

## 2021-12-14 ENCOUNTER — Encounter: Payer: Self-pay | Admitting: Physical Therapy

## 2021-12-14 ENCOUNTER — Ambulatory Visit (INDEPENDENT_AMBULATORY_CARE_PROVIDER_SITE_OTHER): Payer: 59 | Admitting: Physical Therapy

## 2021-12-14 ENCOUNTER — Other Ambulatory Visit: Payer: Self-pay

## 2021-12-14 DIAGNOSIS — M545 Low back pain, unspecified: Secondary | ICD-10-CM | POA: Diagnosis not present

## 2021-12-14 DIAGNOSIS — M25562 Pain in left knee: Secondary | ICD-10-CM | POA: Diagnosis not present

## 2021-12-14 NOTE — Therapy (Signed)
OUTPATIENT PHYSICAL THERAPY TREATMENT   Patient Name: Kyle Welch MRN: [id number] DOB:07/08/1976, 46 y.o., male Today's Date: 12/14/2021   PT End of Session - 12/14/21 1201     Visit Number 2    Number of Visits 12    Date for PT Re-Evaluation 01/22/22    Authorization Type Aet[employer]    PT Start Time 1015    PT Stop Time 1100    PT Time Calculation (min) 45 min    Activity Tolerance Patient tolerated treatment well    Behavior During Therapy 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   LUMBAR LAMINECTOMY/DECOMPRESSION MICRODISCECTOMY Right 10/27/2016   Procedure: RIGHT SIDED LUMBAR 5-SACRUM 1 MICRODISCETOMY;  Surgeon: Phylliss Bob, MD;  Location: [place];  Service: Orthopedics;  Laterality: Right;  RIGHT SIDED LUMBAR 5-SACRUM 1 MICRODISCETOMY   Patient Active Problem List   Diagnosis Date Noted   Radiculopathy 10/27/2016   Lipoma left lower chest wall 04/22/2014   Bipolar affective ([place]) 09/08/2011   Hyperlipidemia 09/08/2011   Preventative health care 09/03/2011   INGUINAL PAIN, RIGHT 06/06/2008   ANXIETY 04/17/2008   DEPRESSION 04/17/2008   SKIN LESION 04/17/2008    PCP: Deland Pretty, MD  REFERRING PROVIDER: Lynne Leader   REFERRING DIAG: Back pain, L knee pain  THERAPY DIAG:  Acute right-sided low back pain without sciatica  Acute pain of left knee  ONSET DATE:   SUBJECTIVE:                                                                                                                                                                                            SUBJECTIVE STATEMENT: Pt states minimal pain on Fri/Sat, and then yesterday and today, R side of back has been sore. Knee feeling ok. Has been doing HEP  PERTINENT HISTORY:  none    PAIN:  Are you having pain? Yes NPRS scale:  2/10 Pain location: Knee  Pain orientation: Left  PAIN TYPE: aching Pain description: intermittent  Aggravating factors: increased activity Relieving factors: none  Are you having pain? Yes NPRS scale:  4/10 Pain location:  Back  Pain orientation: Right  PAIN TYPE: aching Pain description: intermittent  Aggravating factors: Sleeping prone, feels stiff in AM, increased bending, activity  Relieving factors: none stated    PRECAUTIONS: None  WEIGHT BEARING RESTRICTIONS No  FALLS:  Has patient fallen in last 6 months? No, Number of falls: 0  PLOF: Independent  PATIENT GOALS  Decreased pain in back and knee.    OBJECTIVE:   DIAGNOSTIC FINDINGS:  Recent US of L knee: Impression: Concern for degenerative medial meniscus tear.  COGNITION:  Overall cognitive status: Within functional limits for tasks assessed  PALPATION: 12/11/21: Lumbar: tenderness in R low lumbar musculature, into SI, Tightness in R glute, but minimal pain with palpation  L knee: tenderness at medial joint line, and medial patella border, tightness in lateral quad.    AROM: 12/11/20: Lumbar: WFL/ non painful    Hips: mild tightness/limitation for ER bil L>R.  Knee: WNL bil;     LE MMT: 12/11/20:  Hips: 4+/5, Knee: 4+/5;    LUMBAR SPECIAL TESTS:  Lumbar: Neg SLR, Neg SI compression,  Knee: minimal pain with mcmurray, + patella maltracking    TODAY'S TREATMENT    12/14/21: Therapeutic Exercise: Aerobic:  Recumbent bike L2 x 6 min;  Supine:   Bridging x 20;  SLR x 10 bil with TA;  Seated:  LAQ 5lb x 20 slow eccentric lowering;  Sit to stand x10 Standing:  Fwd Step ups 6 in x 10 bil;  Step downs 4 in x  10 on L;  Stretches:  LTR x 10; Supine fig 4 piriformis 30 sec x 3 bil;  Neuromuscular Re-education:  Manual Therapy: STM/DTM to R lumbar paraspinals; long leg distraction on R;    PATIENT EDUCATION:  Education details: Reviewed HEP Person educated: Patient Education method: Explanation, Demonstration, Tactile cues, Verbal cues, and Handouts Education comprehension: verbalized understanding, returned demonstration, verbal cues required, tactile cues required, and needs further education   HOME EXERCISE PROGRAM: Access Code: [email or web address]    ASSESSMENT:  CLINICAL IMPRESSION: 12/14/21:  Pt able to progress activities today without increased pain in back or knee. Did well with ability for step downs with L knee. He is challenged with core stability with addition of SLR today. Pt to benefit from progressing core strength as tolerated.   Objective impairments include decreased activity tolerance, decreased knowledge of use of DME, decreased mobility, decreased ROM, decreased strength, increased muscle spasms, impaired flexibility, improper body mechanics, and pain. These impairments are limiting patient from cleaning, community activity, driving, meal prep, occupation, laundry, and yard work. Personal factors including Time since onset of injury/illness/exacerbation are also affecting patient's functional outcome. Patient will benefit from skilled PT to address above impairments and improve overall function.  REHAB POTENTIAL: Good  CLINICAL DECISION MAKING: Stable/uncomplicated  EVALUATION COMPLEXITY: Low   GOALS: Goals reviewed with patient? Yes  SHORT TERM GOALS:  STG Name Target Date Goal status  1 Patient will be independent with initial HEP   12/25/21 INITIAL             LONG TERM GOALS:   LTG Name Target Date Goal status  1 Patient will be independent with final HEP for back and knee   01/22/22 INITIAL  2 Pt to report decreased pain in L knee and low back to 0-2/10 with  activity   01/22/22 INITIAL  3 Pt to demonstrate improved strength of bil hips and quads to be  5/5, to improve stability and pain in L knee  01/22/22 INITIAL  4 Pt to demonstrate optimal mechanics and ability for bend, lift, squat without pain, to improve ability for IADLs.   01/22/22 INITIAL  5 Pt to report ability for activity and exercise for at least 30 min without increased pain in back or knee, to improve ability for community activities.    01/22/22 INITIAL     PLAN: PT FREQUENCY: 1-2x/week  PT DURATION: 6 weeks  PLANNED INTERVENTIONS: Therapeutic exercises, Therapeutic activity, Neuro Muscular re-education, Patient/Family education, Joint mobilization, Stair training, DME instructions, Dry Needling, Electrical stimulation, Spinal mobilization, Cryotherapy, Moist heat, Taping, Traction, Ultrasound, Ionotophoresis 4mg /ml Dexamethasone, and Manual therapy  PLAN FOR NEXT SESSION: progress core strength.    Lyndee Hensen, PT 12/14/2021, 12:02 PM

## 2021-12-17 ENCOUNTER — Encounter: Payer: Self-pay | Admitting: Physical Therapy

## 2021-12-17 ENCOUNTER — Other Ambulatory Visit: Payer: Self-pay

## 2021-12-17 ENCOUNTER — Ambulatory Visit (INDEPENDENT_AMBULATORY_CARE_PROVIDER_SITE_OTHER): Payer: 59 | Admitting: Physical Therapy

## 2021-12-17 DIAGNOSIS — M545 Low back pain, unspecified: Secondary | ICD-10-CM

## 2021-12-17 DIAGNOSIS — M25562 Pain in left knee: Secondary | ICD-10-CM

## 2021-12-17 NOTE — Therapy (Signed)
OUTPATIENT PHYSICAL THERAPY TREATMENT   Patient Name: Kyle Welch MRN: [id number] DOB:1975/12/07, 46 y.o., male Today's Date: 12/17/2021   PT End of Session - 12/17/21 0810     Visit Number 3    Number of Visits 12    Date for PT Re-Evaluation 01/22/22    Authorization Type Aetna    PT Start Time 0805    PT Stop Time 0843    PT Time Calculation (min) 38 min    Activity Tolerance Patient tolerated treatment well    Behavior During Therapy [facility] for tasks assessed/performed               Past Medical History:  Diagnosis Date   ANXIETY 04/17/2008   Bipolar affective ([place]) 09/08/2011   DEPRESSION 04/17/2008   Family history of adverse reaction to anesthesia    mother had nausea   H/O alcohol abuse    History of kidney stones    November 2017   Hyperlipidemia 09/08/2011   INGUINAL PAIN, RIGHT 06/06/2008   SKIN LESION 04/17/2008   Past Surgical History:  Procedure Laterality Date   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   LUMBAR LAMINECTOMY/DECOMPRESSION MICRODISCECTOMY Right 10/27/2016   Procedure: RIGHT SIDED LUMBAR 5-SACRUM 1 MICRODISCETOMY;  Surgeon: Phylliss Bob, MD;  Location: [place];  Service: Orthopedics;  Laterality: Right;  RIGHT SIDED LUMBAR 5-SACRUM 1 MICRODISCETOMY   Patient Active Problem List   Diagnosis Date Noted   Radiculopathy 10/27/2016   Lipoma left lower chest wall 04/22/2014   Bipolar affective ([place]) 09/08/2011   Hyperlipidemia 09/08/2011   Preventative health care 09/03/2011   INGUINAL PAIN, RIGHT 06/06/2008   ANXIETY 04/17/2008   DEPRESSION 04/17/2008   SKIN LESION 04/17/2008    PCP: Deland Pretty, MD  REFERRING PROVIDER: Lynne Leader   REFERRING DIAG: Back pain, L knee pain  THERAPY DIAG:  Acute right-sided low back pain without sciatica  Acute pain of left knee  ONSET DATE:   SUBJECTIVE:                                                                                                                                                                                            SUBJECTIVE STATEMENT: Pt states minimal back pain today, was sore on Wednesday.   PERTINENT HISTORY:  none    PAIN:  Are you having pain? Yes NPRS scale:  2/10 Pain location: Knee  Pain orientation: Left  PAIN TYPE: aching Pain description: intermittent  Aggravating factors: increased activity Relieving factors: none  Are you having pain? Yes NPRS scale:  4/10 Pain location:  Back  Pain orientation: Right  PAIN TYPE: aching Pain description: intermittent  Aggravating factors: Sleeping prone, feels stiff in AM, increased bending, activity  Relieving factors: none stated    PRECAUTIONS: None  WEIGHT BEARING RESTRICTIONS No  FALLS:  Has patient fallen in last 6 months? No, Number of falls: 0  PLOF: Independent  PATIENT GOALS  Decreased pain in back and knee.    OBJECTIVE:   DIAGNOSTIC FINDINGS:  Recent US of L knee: Impression: Concern for degenerative medial meniscus tear.  COGNITION:  Overall cognitive status: Within functional limits for tasks assessed  PALPATION: 12/11/21: Lumbar: tenderness in R low lumbar musculature, into SI, Tightness in R glute, but minimal pain with palpation  L knee: tenderness at medial joint line, and medial patella border, tightness in lateral quad.    AROM: 12/11/20: Lumbar: WFL/ non painful    Hips: mild tightness/limitation for ER bil L>R.  Knee: WNL bil;     LE MMT: 12/11/20:  Hips: 4+/5, Knee: 4+/5;    LUMBAR SPECIAL TESTS:  Lumbar: Neg SLR, Neg SI compression,  Knee: minimal pain with mcmurray, + patella maltracking    TODAY'S TREATMENT   12/17/21:  Therapeutic Exercise: Aerobic:  Recumbent bike L3 x 5 min;  Supine:   Bridging x 10;  SL bridge x 10 bil;   SLR x 10 bil with TA;  Quadruped opp LE x 20; Prone press ups x 20;  Seated:   Sit to stand x15,  10lb   S/L:  Clams x 20 on R;  Standing:   Stretches:  Supine fig 4 piriformis 30 sec x 3  bil;  Neuromuscular Re-education:  Manual Therapy: Lumbar PA mobs, SI mobs, S/L R post pelvic mobs;  long leg distraction on R;      12/14/21: Therapeutic Exercise: Aerobic:  Recumbent bike L2 x 6 min;  Supine:   Bridging x 20;  SLR x 10 bil with TA;  Seated:  LAQ 5lb x 20 slow eccentric lowering;  Sit to stand x10 Standing:  Fwd Step ups 6 in x 10 bil;  Step downs 4 in x 10 on L;  Stretches:  LTR x 10; Supine fig 4 piriformis 30 sec x 3 bil;  Neuromuscular Re-education:  Manual Therapy: STM/DTM to R lumbar paraspinals; long leg distraction on R;    PATIENT EDUCATION:  Education details: Reviewed HEP Person educated: Patient Education method: Explanation, Demonstration, Tactile cues, Verbal cues, and Handouts Education comprehension: verbalized understanding, returned demonstration, verbal cues required, tactile cues required, and needs further education   HOME EXERCISE PROGRAM: Access Code: [email or web address]    ASSESSMENT:  CLINICAL IMPRESSION: 12/17/21:  Pt doing well with ther ex progression. Minimal pain today. Area of most soreness is low L4/5/ SI with palpation on R. Focus on adding glute and hip rotator strength today. Plan to continue strength progression as tolerated, and continue manual as needed for pain.   Objective impairments include decreased activity tolerance, decreased knowledge of use of DME, decreased mobility, decreased ROM, decreased strength, increased muscle spasms, impaired flexibility, improper body mechanics, and pain. These impairments are limiting patient from cleaning, community activity, driving, meal prep, occupation, laundry, and yard work. Personal factors including Time since onset of injury/illness/exacerbation are also affecting patient's functional outcome. Patient will benefit from skilled PT to address above impairments and improve overall function.  REHAB POTENTIAL: Good  CLINICAL DECISION MAKING: Stable/uncomplicated  EVALUATION COMPLEXITY:  Low   GOALS: Goals reviewed with patient? Yes  SHORT TERM GOALS:  STG Name  Target Date Goal status  1 Patient will be independent with initial HEP   12/25/21 INITIAL             LONG TERM GOALS:   LTG Name Target Date Goal status  1 Patient will be independent with final HEP for back and knee   01/22/22 INITIAL  2 Pt to report decreased pain in L knee and low back to 0-2/10 with activity   01/22/22 INITIAL  3 Pt to demonstrate improved strength of bil hips and quads to be 5/5, to improve stability and pain in L knee  01/22/22 INITIAL  4 Pt to demonstrate optimal mechanics and ability for bend, lift, squat without pain, to improve ability for IADLs.   01/22/22 INITIAL  5 Pt to report ability for activity and exercise for at least 30 min without increased pain in back or knee, to improve ability for community activities.    01/22/22 INITIAL     PLAN: PT FREQUENCY: 1-2x/week  PT DURATION: 6 weeks  PLANNED INTERVENTIONS: Therapeutic exercises, Therapeutic activity, Neuro Muscular re-education, Patient/Family education, Joint mobilization, Stair training, DME instructions, Dry Needling, Electrical stimulation, Spinal mobilization, Cryotherapy, Moist heat, Taping, Traction, Ultrasound, Ionotophoresis 4mg /ml Dexamethasone, and Manual therapy  PLAN FOR NEXT SESSION: progress core strength.    Lyndee Hensen, PT, DPT 8:47 AM  12/17/21

## 2021-12-18 DIAGNOSIS — K4090 Unilateral inguinal hernia, without obstruction or gangrene, not specified as recurrent: Secondary | ICD-10-CM | POA: Diagnosis not present

## 2021-12-19 DIAGNOSIS — G4733 Obstructive sleep apnea (adult) (pediatric): Secondary | ICD-10-CM | POA: Diagnosis not present

## 2021-12-21 ENCOUNTER — Inpatient Hospital Stay (HOSPITAL_BASED_OUTPATIENT_CLINIC_OR_DEPARTMENT_OTHER): Payer: 59 | Admitting: Nurse Practitioner

## 2021-12-21 ENCOUNTER — Inpatient Hospital Stay: Payer: 59 | Attending: Nurse Practitioner

## 2021-12-21 ENCOUNTER — Encounter: Payer: Self-pay | Admitting: Nurse Practitioner

## 2021-12-21 ENCOUNTER — Other Ambulatory Visit: Payer: Self-pay

## 2021-12-21 VITALS — BP 137/97 | HR 84 | Temp 97.7°F | Resp 18 | Wt 176.3 lb

## 2021-12-21 DIAGNOSIS — E785 Hyperlipidemia, unspecified: Secondary | ICD-10-CM | POA: Diagnosis not present

## 2021-12-21 DIAGNOSIS — L659 Nonscarring hair loss, unspecified: Secondary | ICD-10-CM | POA: Diagnosis not present

## 2021-12-21 DIAGNOSIS — F319 Bipolar disorder, unspecified: Secondary | ICD-10-CM | POA: Diagnosis not present

## 2021-12-21 DIAGNOSIS — F419 Anxiety disorder, unspecified: Secondary | ICD-10-CM

## 2021-12-21 DIAGNOSIS — M545 Low back pain, unspecified: Secondary | ICD-10-CM

## 2021-12-21 DIAGNOSIS — Z79899 Other long term (current) drug therapy: Secondary | ICD-10-CM

## 2021-12-21 DIAGNOSIS — D751 Secondary polycythemia: Secondary | ICD-10-CM

## 2021-12-21 DIAGNOSIS — M25562 Pain in left knee: Secondary | ICD-10-CM | POA: Diagnosis not present

## 2021-12-21 DIAGNOSIS — R69 Illness, unspecified: Secondary | ICD-10-CM | POA: Diagnosis not present

## 2021-12-21 DIAGNOSIS — Z87891 Personal history of nicotine dependence: Secondary | ICD-10-CM

## 2021-12-21 LAB — CBC WITH DIFFERENTIAL (CANCER CENTER ONLY)
Abs Immature Granulocytes: 0.02 10*3/uL (ref 0.00–0.07)
Basophils Absolute: 0 10*3/uL (ref 0.0–0.1)
Basophils Relative: 0 %
Eosinophils Absolute: 0.1 10*3/uL (ref 0.0–0.5)
Eosinophils Relative: 1 %
HCT: 50.5 % (ref 39.0–52.0)
Hemoglobin: 17.4 g/dL — ABNORMAL HIGH (ref 13.0–17.0)
Immature Granulocytes: 0 %
Lymphocytes Relative: 31 %
Lymphs Abs: 2 10*3/uL (ref 0.7–4.0)
MCH: 30.2 pg (ref 26.0–34.0)
MCHC: 34.5 g/dL (ref 30.0–36.0)
MCV: 87.5 fL (ref 80.0–100.0)
Monocytes Absolute: 0.5 10*3/uL (ref 0.1–1.0)
Monocytes Relative: 8 %
Neutro Abs: 3.9 10*3/uL (ref 1.7–7.7)
Neutrophils Relative %: 60 %
Platelet Count: 221 10*3/uL (ref 150–400)
RBC: 5.77 MIL/uL (ref 4.22–5.81)
RDW: 12.1 % (ref 11.5–15.5)
WBC Count: 6.5 10*3/uL (ref 4.0–10.5)
nRBC: 0 % (ref 0.0–0.2)

## 2021-12-21 NOTE — Progress Notes (Addendum)
[place]   Telephone:([phone]) [phone] Fax:(336) [phone]   Clinic New consult Note   Patient Care Team: Deland Pretty, MD as PCP - General (Internal Medicine) 12/21/2021  CHIEF COMPLAINTS/PURPOSE OF CONSULTATION:  Erythrocytosis, referred by PCP Dr. Deland Pretty  HISTORY OF PRESENTING ILLNESS:  Kyle Welch 46 y.o. male with PMH including anxiety, depression, bipolar, ADHD, HL, and back pain is here because of erythrocytosis.  First epic labs 04/09/2008 showed RBC 5.54, hemoglobin 17.7, hematocrit 49.9% and otherwise normal CBC.  In 2011 hemoglobin/hematocrit normalized to 16.8 and 47.2%.  On 07/28/2011 hemoglobin up to 17.6 and hematocrit 51.2%.  Next CBC in our system 10/27/2016 showed hemoglobin 16.6/HCT 46.3%.  Most recently seen by PCP 11/30/2021 showing a normal CBC except RBC 5.53 and HCT 47.6, normal hemoglobin 16.5.   Socially he is married with 3 sons ages 67 and 29 who live in the home, and an 45 year old who lives with his grandmother.  Works from home in [employer], [employer], he does not get much physical exercise.  He reports heavy alcohol and drug use for 3-4 years during college, but sober now 3 years, and a former smoker x15 years less than 1 pack/day, quit 12-13 years ago.  Patient denies lung disease, sleep apnea, or recurrent respiratory infections.  His mother may have recurrent bronchitis.  A maternal grandfather had cancer.  Today, he presents by himself.  He has daytime drowsiness, recent sleep study showed no apnea.  Appetite and weight are normal for him.  He drinks a lot of coffee and does not think he is well-hydrated.  He had a discectomy 5 years ago with recurrent back pain over the last 6 months.  Also has left knee pain.  He is currently in PT.  He has new alopecia, seeing dermatology and began cortisone injections.  Denies other hormone/steroid use.  He plans to have inguinal hernia repair in June.  Denies history of thrombosis, headache, vision change, pruritus,  n/v/c/d, cough, chest pain, dyspnea.   MEDICAL HISTORY:  Past Medical History:  Diagnosis Date   ANXIETY 04/17/2008   Bipolar affective ([place]) 09/08/2011   DEPRESSION 04/17/2008   Family history of adverse reaction to anesthesia    mother had nausea   H/O alcohol abuse    History of kidney stones    November 2017   Hyperlipidemia 09/08/2011   INGUINAL PAIN, RIGHT 06/06/2008   SKIN LESION 04/17/2008    SURGICAL HISTORY: Past Surgical History:  Procedure Laterality Date   CONGENITAL URETER MALFORMATION REPAIR  1994   HERNIA REPAIR     Left   LUMBAR LAMINECTOMY/DECOMPRESSION MICRODISCECTOMY Right 10/27/2016   Procedure: RIGHT SIDED LUMBAR 5-SACRUM 1 MICRODISCETOMY;  Surgeon: Phylliss Bob, MD;  Location: [facility];  Service: Orthopedics;  Laterality: Right;  RIGHT SIDED LUMBAR 5-SACRUM 1 MICRODISCETOMY    SOCIAL HISTORY: Social History   Socioeconomic History   Marital status: Married    Spouse name: Not on file   Number of children: 3   Years of education: Not on file   Highest education level: Not on file  Occupational History   Not on file  Tobacco Use   Smoking status: Former    Years: 15.00    Types: Cigarettes    Quit date: 09/30/2009    Years since quitting: 12.2   Smokeless tobacco: Never  Substance and Sexual Activity   Alcohol use: Not Currently    Comment: 3-4 year h/o alcohol abuse in college, sober now 3 years   Drug use:  No    Comment: none since college   Sexual activity: Not on file  Other Topics Concern   Not on file  Social History Narrative   Not on file   Social Determinants of Health   Financial Resource Strain: Not on file  Food Insecurity: Not on file  Transportation Needs: Not on file  Physical Activity: Not on file  Stress: Not on file  Social Connections: Not on file  Intimate Partner Violence: Not on file    FAMILY HISTORY: Family History  Problem Relation Age of Onset   Alcohol abuse Maternal Uncle        several   Coronary artery  disease Maternal Grandfather    Alcohol abuse Other     ALLERGIES:  is allergic to codeine.  MEDICATIONS:  Current Outpatient Medications  Medication Sig Dispense Refill   clobetasol (TEMOVATE) 0.05 % external solution APPLY TO AFFECTED AREA TWICE A DAY     clonazePAM (KLONOPIN) 0.5 MG tablet Take 0.5 mg by mouth 2 (two) times daily.     FLUoxetine (PROZAC) 20 MG capsule Take 20 mg by mouth daily.     lamoTRIgine (LAMICTAL) 100 MG tablet Take 150 mg by mouth daily.     No current facility-administered medications for this visit.    REVIEW OF SYSTEMS:   Constitutional: Denies fevers, chills, abnormal night sweats, unintentional weight loss (+) daytime drowsiness Eyes: Denies blurriness of vision, double vision or watery eyes (+) wears glasses Ears, nose, mouth, throat, and face: Denies mucositis or sore throat Respiratory: Denies cough, dyspnea or wheezes Cardiovascular: Denies palpitation, chest discomfort, lower extremity swelling, or history of thrombosis Gastrointestinal:  Denies nausea, vomiting, constipation, diarrhea, bleeding, heartburn or change in bowel habits Skin: Denies abnormal skin rashes Lymphatics: Denies new lymphadenopathy or easy bruising Neurological:Denies numbness, tingling or new weaknesses MSK: (+) Back pain (+) left knee pain Behavioral/Psych: (+) Anxiety  (+) depression (+) mood is stable on current med regimen All other systems were reviewed with the patient and are negative.  PHYSICAL EXAMINATION: ECOG PERFORMANCE STATUS: 0 - Asymptomatic  Vitals:   12/21/21 1114  BP: (!) 137/97  Pulse: 84  Resp: 18  Temp: 97.7 F (36.5 C)  SpO2: 99%   Filed Weights   12/21/21 1114  Weight: 176 lb 5 oz (80 kg)    GENERAL:alert, no distress and comfortable SKIN: No rash.  Palms without erythema EYES: sclera clear NECK: Without mass LYMPH:  no palpable cervical or supraclavicular lymphadenopathy  LUNGS: clear with normal breathing effort HEART: regular  rate & rhythm, no lower extremity edema ABDOMEN:abdomen soft, non-tender and normal bowel sounds Musculoskeletal:no cyanosis of digits and no clubbing  PSYCH: alert & oriented x 3 with fluent speech NEURO: no focal motor/sensory deficits  LABORATORY DATA:  I have reviewed the data as listed CBC Latest Ref Rng & Units 12/21/2021 10/27/2016 07/28/2011  WBC 4.0 - 10.5 K/uL 6.5 7.1 5.8  Hemoglobin 13.0 - 17.0 g/dL 17.4(H) 16.6 17.6(H)  Hematocrit 39.0 - 52.0 % 50.5 46.3 51.2  Platelets 150 - 400 K/uL 221 199 164.0    CMP Latest Ref Rng & Units 10/27/2016 07/29/2011 07/28/2011  Glucose 65 - 99 mg/dL 88 - 80  BUN 6 - 20 mg/dL 10 - 15  Creatinine 0.61 - 1.24 mg/dL 0.93 - 0.9  Sodium 135 - 145 mmol/L 139 - 141  Potassium 3.5 - 5.1 mmol/L 3.7 - 4.3  Chloride 101 - 111 mmol/L 105 - 103  CO2 22 - 32 mmol/L  28 - 30  Calcium 8.9 - 10.3 mg/dL 9.5 - 9.4  Total Protein 6.5 - 8.1 g/dL 7.1 8.0 7.4  Total Bilirubin 0.3 - 1.2 mg/dL 2.0(H) 1.4(H) 1.9(H)  Alkaline Phos 38 - 126 U/L 66 65 67  AST 15 - 41 U/L 22 26 22   ALT 17 - 63 U/L 25 15 16      RADIOGRAPHIC STUDIES: I have personally reviewed the radiological images as listed and agreed with the findings in the report. DG Lumbar Spine Complete  Result Date: 12/04/2021 CLINICAL DATA:  Right-sided low back pain for six months. EXAM: LUMBAR SPINE - COMPLETE 4+ VIEW COMPARISON:  X-ray 10/27/2016; MRI 04/20/2016. FINDINGS: Transitional lumbar anatomy with a partially sacralized S1 and a hypoplastic disc space at S1-2. There is no evidence of lumbar spine fracture. Alignment is normal. Intervertebral disc spaces are maintained. IMPRESSION: Transitional lumbar anatomy.  Otherwise unremarkable examination. Electronically Signed   By: Fidela Salisbury M.D.   On: 12/04/2021 20:23    ASSESSMENT & PLAN: 47 year old male  Erythrocytosis -We reviewed his historical medical record and outside labs in detail with the patient.  He has had mild and intermittent  erythrocytosis since at least 2009, highest H/H 17.7 and 51.2%.  -We reviewed common etiologies.  I encouraged him to hydrate more.  I reviewed his medication list, erythrocytosis is not common for his regimen.  -he is not on testosterone/hormones - OSA has been ruled out.  No history of chronic lung infection. No history of thrombosis. -We will repeat CBC today to confirm and check erythropoietin to rule out epo-secreting tumor, and JAK2 to rule out PV.  My suspicion for PV is low, given the mild intermittent nature and he does not have clinical symptoms, but this is easy to rule out  -We reviewed this is likely benign, possibly secondary polycythemia related to his distant smoking history -We discussed thrombosis risk is lower with secondary polycythemia, encouraged him to live healthy active lifestyle and increase physical activity -We will follow-up with the results of his blood work from today,  -As of now the recommendation is to donate blood once per year or every other year -Follow-up open, pending results  Back and left knee pain -S/p discectomy at [facility] 5 years ago, recurrent/progressive low back pain x6 months and concurrent left knee pain -Management per Ortho Dr. Lynne Leader  Alopecia -Recently began cortisone injections and topical therapy.  - Denies any other hormone/steroid/testosterone use which may contribute to elevated RBC -Management per dermatology  Health maintenance -He has prior alcohol and smoking history, quit smoking 12-13 years ago -Sleep study ruled out OSA -Continue healthy active lifestyle, per PCP   PLAN: -medical record/labs reviewed -lab today, will follow up on the results -donate blood once per year or every other year to manage erythrocytosis -f/up open, pending results  -patient seen with Dr. Burr Medico  -CC note to referring provider Dr. Deland Pretty  Orders Placed This Encounter  Procedures   CBC with Differential ([facility]  Only)    Standing Status:   Standing    Number of Occurrences:   1    Standing Expiration Date:   12/21/2022   Erythropoietin    Standing Status:   Standing    Number of Occurrences:   1    Standing Expiration Date:   12/21/2022   Jak 2 Exon 12 ([facility])    Standing Status:   Standing    Number of Occurrences:   1    Standing  Expiration Date:   12/21/2022      All questions were answered. The patient knows to call the clinic with any problems, questions or concerns.     Alla Feeling, NP 12/21/21   Addendum  I have seen the patient, examined him. I agree with the assessment and and plan and have edited the notes.   46 yo male was referred for mild erythrocytosis, which has been intermittent for the past 14 years, with highest hematocrit 51%.  He had a remote history of smoking, recent sleep study was negative for sleep apnea, he denies any testosterone replacement or any over-the-counter supplement.  We discussed that his mild erythrocytosis is likely secondary, although no obvious cause at this point except his history of smoking.I recommend testing JAk2 mutations and serum EPO level,to rule out PV and epo secretion from tumor.  If that is negative/normal, no follow-up with [place] is needed.  I encouraged him to donate blood once or twice a year, and monitor CBC with his PCP.  All questions were answered.  Truitt Merle  12/21/2021

## 2021-12-23 ENCOUNTER — Other Ambulatory Visit: Payer: Self-pay

## 2021-12-23 ENCOUNTER — Ambulatory Visit (INDEPENDENT_AMBULATORY_CARE_PROVIDER_SITE_OTHER): Payer: 59 | Admitting: Physical Therapy

## 2021-12-23 ENCOUNTER — Encounter: Payer: Self-pay | Admitting: Physical Therapy

## 2021-12-23 DIAGNOSIS — M545 Low back pain, unspecified: Secondary | ICD-10-CM

## 2021-12-23 DIAGNOSIS — M25562 Pain in left knee: Secondary | ICD-10-CM

## 2021-12-23 LAB — ERYTHROPOIETIN: Erythropoietin: 8.7 m[IU]/mL (ref 2.6–18.5)

## 2021-12-23 NOTE — Therapy (Signed)
OUTPATIENT PHYSICAL THERAPY TREATMENT   Patient Name: Kyle Welch MRN: [id number] DOB:01-07-1976, 46 y.o., male Today's Date: 12/23/2021   PT End of Session - 12/23/21 0855     Visit Number 4    Number of Visits 12    Date for PT Re-Evaluation 01/22/22    Authorization Type [employer]    PT Start Time [phone]    PT Stop Time 0930    PT Time Calculation (min) 40 min    Activity Tolerance Patient tolerated treatment well    Behavior During Therapy [facility] for tasks assessed/performed               Past Medical History:  Diagnosis Date   ANXIETY 04/17/2008   Bipolar affective ([place]) 09/08/2011   DEPRESSION 04/17/2008   Family history of adverse reaction to anesthesia    mother had nausea   H/O alcohol abuse    History of kidney stones    November 2017   Hyperlipidemia 09/08/2011   INGUINAL PAIN, RIGHT 06/06/2008   SKIN LESION 04/17/2008   Past Surgical History:  Procedure Laterality Date   CONGENITAL URETER MALFORMATION REPAIR  1994   HERNIA REPAIR     Left   LUMBAR LAMINECTOMY/DECOMPRESSION MICRODISCECTOMY Right 10/27/2016   Procedure: RIGHT SIDED LUMBAR 5-SACRUM 1 MICRODISCETOMY;  Surgeon: Phylliss Bob, MD;  Location: [facility];  Service: Orthopedics;  Laterality: Right;  RIGHT SIDED LUMBAR 5-SACRUM 1 MICRODISCETOMY   Patient Active Problem List   Diagnosis Date Noted   Radiculopathy 10/27/2016   Lipoma left lower chest wall 04/22/2014   Bipolar affective ([place]) 09/08/2011   Hyperlipidemia 09/08/2011   Preventative health care 09/03/2011   INGUINAL PAIN, RIGHT 06/06/2008   ANXIETY 04/17/2008   DEPRESSION 04/17/2008   SKIN LESION 04/17/2008    PCP: Deland Pretty, MD  REFERRING PROVIDER: Lynne Leader   REFERRING DIAG: Back pain, L knee pain  THERAPY DIAG:  Acute right-sided low back pain without sciatica  Acute pain of left knee  ONSET DATE:   SUBJECTIVE:                                                                                                                                                                                            SUBJECTIVE STATEMENT: Pt states minimal pain in back and knee, doing better.   PERTINENT HISTORY:  none    PAIN:  Are you having pain? Yes NPRS scale:  2/10 Pain location: Knee  Pain orientation: Left  PAIN TYPE: aching Pain description: intermittent  Aggravating factors: increased activity Relieving factors: none  Are you having pain? Yes NPRS scale:  4/10 Pain location:  Back  Pain orientation: Right  PAIN TYPE: aching Pain description: intermittent  Aggravating factors: Sleeping prone, feels stiff in AM, increased bending, activity  Relieving factors: none stated    PRECAUTIONS: None  WEIGHT BEARING RESTRICTIONS No  FALLS:  Has patient fallen in last 6 months? No, Number of falls: 0  PLOF: Independent  PATIENT GOALS  Decreased pain in back and knee.    OBJECTIVE:   DIAGNOSTIC FINDINGS:  Recent US of L knee: Impression: Concern for degenerative medial meniscus tear.  COGNITION:  Overall cognitive status: Within functional limits for tasks assessed  PALPATION: 12/11/21: Lumbar: tenderness in R low lumbar musculature, into SI, Tightness in R glute, but minimal pain with palpation  L knee: tenderness at medial joint line, and medial patella border, tightness in lateral quad.    AROM: 12/11/20: Lumbar: WFL/ non painful    Hips: mild tightness/limitation for ER bil L>R.  Knee: WNL bil;     LE MMT: 12/11/20:  Hips: 4+/5, Knee: 4+/5;    LUMBAR SPECIAL TESTS:  Lumbar: Neg SLR, Neg SI compression,  Knee: minimal pain with mcmurray, + patella maltracking    TODAY'S TREATMENT   12/23/21: Therapeutic Exercise: Aerobic:  Recumbent bike L3 x 7 min;  Supine:  SL bridge x 10 bil;   Prone:  Quadruped opp LE x 20;  Prone press ups x 20; Planks 30 sec x 3;  Seated:   Single leg squat to high mat table x 10 bil;   S/L:  Clams x 20 on R;  Standing:  Band walks Blue TB 25 ft x 6; Paloff Press Blue  TB x 15 bil;    Stretches:   Neuromuscular Re-education:  Manual Therapy:     12/17/21:  Therapeutic Exercise: Aerobic:  Recumbent bike L3 x 5 min;  Supine:   Bridging x 10;  SL bridge x 10 bil;   SLR x 10 bil with TA;  Quadruped opp LE x 20; Prone press ups x 20;  Seated:   Sit to stand x15,  10lb   S/L:  Clams x 20 on R;  Standing:   Stretches:  Supine fig 4 piriformis 30 sec x 3 bil;  Neuromuscular Re-education:  Manual Therapy: Lumbar PA mobs, SI mobs, S/L R post pelvic mobs;  long leg distraction on R;      12/14/21: Therapeutic Exercise: Aerobic:  Recumbent bike L2 x 6 min;  Supine:   Bridging x 20;  SLR x 10 bil with TA;  Seated:  LAQ 5lb x 20 slow eccentric lowering;  Sit to stand x10 Standing:  Fwd Step ups 6 in x 10 bil;  Step downs 4 in x 10 on L;  Stretches:  LTR x 10; Supine fig 4 piriformis 30 sec x 3 bil;  Neuromuscular Re-education:  Manual Therapy: STM/DTM to R lumbar paraspinals; long leg distraction on R;    PATIENT EDUCATION:  Education details: Reviewed HEP Person educated: Patient Education method: Explanation, Demonstration, Tactile cues, Verbal cues, and Handouts Education comprehension: verbalized understanding, returned demonstration, verbal cues required, tactile cues required, and needs further education   HOME EXERCISE PROGRAM: Access Code: [email or web address]    ASSESSMENT:  CLINICAL IMPRESSION: 12/23/21: Pt progressing well, with decreased pain. He has been able to progress strengthening without increased pain, will benefit from continued strengthening.   Objective impairments include decreased activity tolerance, decreased knowledge of use of DME, decreased mobility, decreased ROM, decreased strength, increased muscle spasms, impaired flexibility, improper body mechanics, and pain. These impairments are limiting  patient from cleaning, community activity, driving, meal prep, occupation, laundry, and yard work. Personal factors including Time since  onset of injury/illness/exacerbation are also affecting patient's functional outcome. Patient will benefit from skilled PT to address above impairments and improve overall function.  REHAB POTENTIAL: Good  CLINICAL DECISION MAKING: Stable/uncomplicated  EVALUATION COMPLEXITY: Low   GOALS: Goals reviewed with patient? Yes  SHORT TERM GOALS:  STG Name Target Date Goal status  1 Patient will be independent with initial HEP   12/25/21 INITIAL             LONG TERM GOALS:   LTG Name Target Date Goal status  1 Patient will be independent with final HEP for back and knee   01/22/22 INITIAL  2 Pt to report decreased pain in L knee and low back to 0-2/10 with activity   01/22/22 INITIAL  3 Pt to demonstrate improved strength of bil hips and quads to be 5/5, to improve stability and pain in L knee  01/22/22 INITIAL  4 Pt to demonstrate optimal mechanics and ability for bend, lift, squat without pain, to improve ability for IADLs.   01/22/22 INITIAL  5 Pt to report ability for activity and exercise for at least 30 min without increased pain in back or knee, to improve ability for community activities.    01/22/22 INITIAL     PLAN: PT FREQUENCY: 1-2x/week  PT DURATION: 6 weeks  PLANNED INTERVENTIONS: Therapeutic exercises, Therapeutic activity, Neuro Muscular re-education, Patient/Family education, Joint mobilization, Stair training, DME instructions, Dry Needling, Electrical stimulation, Spinal mobilization, Cryotherapy, Moist heat, Taping, Traction, Ultrasound, Ionotophoresis 4mg /ml Dexamethasone, and Manual therapy  PLAN FOR NEXT SESSION: progress core strength.    Lyndee Hensen, PT, DPT 8:56 AM  12/23/21

## 2021-12-25 ENCOUNTER — Ambulatory Visit (INDEPENDENT_AMBULATORY_CARE_PROVIDER_SITE_OTHER): Payer: 59 | Admitting: Physical Therapy

## 2021-12-25 ENCOUNTER — Other Ambulatory Visit: Payer: Self-pay

## 2021-12-25 ENCOUNTER — Encounter: Payer: Self-pay | Admitting: Physical Therapy

## 2021-12-25 DIAGNOSIS — M545 Low back pain, unspecified: Secondary | ICD-10-CM | POA: Diagnosis not present

## 2021-12-25 DIAGNOSIS — M25562 Pain in left knee: Secondary | ICD-10-CM | POA: Diagnosis not present

## 2021-12-25 NOTE — Therapy (Addendum)
OUTPATIENT PHYSICAL THERAPY TREATMENT   Patient Name: Kyle Welch MRN: [id number] DOB:Mar 20, 1976, 46 y.o., male Today's Date: 12/25/21   PT End of Session - 12/27/21 2027     Visit Number 5    Number of Visits 12    Date for PT Re-Evaluation 01/22/22    Authorization Type [employer]    PT Start Time 0805    PT Stop Time 0845    PT Time Calculation (min) 40 min    Activity Tolerance Patient tolerated treatment well    Behavior During Therapy [facility] for tasks assessed/performed                Past Medical History:  Diagnosis Date   ANXIETY 04/17/2008   Bipolar affective ([place]) 09/08/2011   DEPRESSION 04/17/2008   Family history of adverse reaction to anesthesia    mother had nausea   H/O alcohol abuse    History of kidney stones    November 2017   Hyperlipidemia 09/08/2011   INGUINAL PAIN, RIGHT 06/06/2008   SKIN LESION 04/17/2008   Past Surgical History:  Procedure Laterality Date   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   LUMBAR LAMINECTOMY/DECOMPRESSION MICRODISCECTOMY Right 10/27/2016   Procedure: RIGHT SIDED LUMBAR 5-SACRUM 1 MICRODISCETOMY;  Surgeon: Phylliss Bob, MD;  Location: [place];  Service: Orthopedics;  Laterality: Right;  RIGHT SIDED LUMBAR 5-SACRUM 1 MICRODISCETOMY   Patient Active Problem List   Diagnosis Date Noted   Radiculopathy 10/27/2016   Lipoma left lower chest wall 04/22/2014   Bipolar affective ([place]) 09/08/2011   Hyperlipidemia 09/08/2011   Preventative health care 09/03/2011   INGUINAL PAIN, RIGHT 06/06/2008   ANXIETY 04/17/2008   DEPRESSION 04/17/2008   SKIN LESION 04/17/2008    PCP: Deland Pretty, MD  REFERRING PROVIDER: Lynne Leader   REFERRING DIAG: Back pain, L knee pain  THERAPY DIAG:  Acute right-sided low back pain without sciatica  Acute pain of left knee  ONSET DATE:   SUBJECTIVE:                                                                                                                                                                                            SUBJECTIVE STATEMENT: Pt states minimal pain in back and knee this week. Did feel some "soreness" in area of hernia after activity one day this week.   PERTINENT HISTORY:  none    PAIN:  Are you having pain? Yes NPRS scale:  2/10 Pain location: Knee  Pain orientation: Left  PAIN TYPE: aching Pain description: intermittent  Aggravating factors: increased activity Relieving factors:  none  Are you having pain? Yes NPRS scale:  3/10 Pain location:  Back  Pain orientation: Right  PAIN TYPE: aching/soreness  Pain description: intermittent  Aggravating factors: Sleeping prone, feels stiff in AM, increased bending, activity  Relieving factors: none stated    PRECAUTIONS: None  WEIGHT BEARING RESTRICTIONS No  FALLS:  Has patient fallen in last 6 months? No, Number of falls: 0  PLOF: Independent  PATIENT GOALS  Decreased pain in back and knee.    OBJECTIVE:   DIAGNOSTIC FINDINGS:  Recent US of L knee: Impression: Concern for degenerative medial meniscus tear.  COGNITION:  Overall cognitive status: Within functional limits for tasks assessed  PALPATION: 12/11/21: Lumbar: tenderness in R low lumbar musculature, into SI, Tightness in R glute, but minimal pain with palpation  L knee: tenderness at medial joint line, and medial patella border, tightness in lateral quad.    AROM: 12/11/20: Lumbar: WFL/ non painful    Hips: mild tightness/limitation for ER bil L>R.  Knee: WNL bil;     LE MMT: 12/11/20:  Hips: 4+/5, Knee: 4+/5;    LUMBAR SPECIAL TESTS:  Lumbar: Neg SLR, Neg SI compression,  Knee: minimal pain with mcmurray, + patella maltracking    TODAY'S TREATMENT   12/25/21: Therapeutic Exercise: Aerobic:  Recumbent bike L3 x 6 min;  Supine: Bridge x 20;  Prone:  Quadruped opp LE x 20; Prone press ups x 10; Planks 30 sec x 3;  Seated:     S/L:   Standing:  Band walks Blue TB 25 ft x 6; Paloff  Press Blue TB x 15 bil; 12 in step ups x12 bil, no UE support;  Stretches:   Neuromuscular Re-education:  Manual Therapy:    12/23/21: Therapeutic Exercise: Aerobic:  Recumbent bike L3 x 7 min;  Supine:  SL bridge x 10 bil;   Prone:  Quadruped opp LE x 20;  Prone press ups x 20; Planks 30 sec x 3;  Seated:   Single leg squat to high mat table x 10 bil;   S/L:  Clams x 20 on R;  Standing:  Band walks Blue TB 25 ft x 6; Paloff Press Blue TB x 15 bil;    Stretches:   Neuromuscular Re-education:  Manual Therapy:     PATIENT EDUCATION:  Education details: Reviewed HEP Person educated: Patient Education method: Consulting civil engineer, Media planner, Corporate treasurer cues, Verbal cues, and Handouts Education comprehension: verbalized understanding, returned demonstration, verbal cues required, tactile cues required, and needs further education   HOME EXERCISE PROGRAM: Access Code: [email or web address]    ASSESSMENT:  CLINICAL IMPRESSION: 12/25/21: Pt progressing well with strengthening. He has improved ability for step up on higher step today, without pain in knee or back. He will benefit from progressive strengthening, and education on optimal squat mechanics next visit. Pain levels improving.   Objective impairments include decreased activity tolerance, decreased knowledge of use of DME, decreased mobility, decreased ROM, decreased strength, increased muscle spasms, impaired flexibility, improper body mechanics, and pain. These impairments are limiting patient from cleaning, community activity, driving, meal prep, occupation, laundry, and yard work. Personal factors including Time since onset of injury/illness/exacerbation are also affecting patient's functional outcome. Patient will benefit from skilled PT to address above impairments and improve overall function.  REHAB POTENTIAL: Good  CLINICAL DECISION MAKING: Stable/uncomplicated  EVALUATION COMPLEXITY: Low   GOALS: Goals reviewed with patient?  Yes  SHORT TERM GOALS:  STG Name Target Date Goal status  1 Patient will be independent with initial  HEP   12/25/21 INITIAL             LONG TERM GOALS:   LTG Name Target Date Goal status  1 Patient will be independent with final HEP for back and knee   01/22/22 INITIAL  2 Pt to report decreased pain in L knee and low back to 0-2/10 with activity   01/22/22 INITIAL  3 Pt to demonstrate improved strength of bil hips and quads to be 5/5, to improve stability and pain in L knee  01/22/22 INITIAL  4 Pt to demonstrate optimal mechanics and ability for bend, lift, squat without pain, to improve ability for IADLs.   01/22/22 INITIAL  5 Pt to report ability for activity and exercise for at least 30 min without increased pain in back or knee, to improve ability for community activities.    01/22/22 INITIAL     PLAN: PT FREQUENCY: 1-2x/week  PT DURATION: 6 weeks  PLANNED INTERVENTIONS: Therapeutic exercises, Therapeutic activity, Neuro Muscular re-education, Patient/Family education, Joint mobilization, Stair training, DME instructions, Dry Needling, Electrical stimulation, Spinal mobilization, Cryotherapy, Moist heat, Taping, Traction, Ultrasound, Ionotophoresis 4mg /ml Dexamethasone, and Manual therapy  PLAN FOR NEXT SESSION: progress core strength.    Lyndee Hensen, PT, DPT 8:28 PM  12/27/21  PHYSICAL THERAPY DISCHARGE SUMMARY  Visits from Start of Care:5 Plan: Patient agrees to discharge.  Patient goals were partially met. Patient is being discharged due to- pt did not return after visit [place], PT, DPT 12:57 PM  11/25/22

## 2021-12-27 ENCOUNTER — Encounter: Payer: Self-pay | Admitting: Physical Therapy

## 2021-12-28 DIAGNOSIS — K4090 Unilateral inguinal hernia, without obstruction or gangrene, not specified as recurrent: Secondary | ICD-10-CM | POA: Diagnosis not present

## 2021-12-28 DIAGNOSIS — R030 Elevated blood-pressure reading, without diagnosis of hypertension: Secondary | ICD-10-CM | POA: Diagnosis not present

## 2021-12-28 DIAGNOSIS — D751 Secondary polycythemia: Secondary | ICD-10-CM | POA: Diagnosis not present

## 2021-12-28 DIAGNOSIS — L639 Alopecia areata, unspecified: Secondary | ICD-10-CM | POA: Diagnosis not present

## 2021-12-28 LAB — JAK 2 EXON 12 (GENPATH)

## 2021-12-29 ENCOUNTER — Telehealth: Payer: Self-pay | Admitting: Nurse Practitioner

## 2021-12-29 ENCOUNTER — Telehealth: Payer: Self-pay | Admitting: Family Medicine

## 2021-12-29 NOTE — Telephone Encounter (Signed)
I did receive the images on a CD of your knee x-ray.  Thank you for providing them.  The CD is available for pickup at the front desk

## 2021-12-29 NOTE — Telephone Encounter (Signed)
I called Mr. Cousins to review work up. EPO is normal, no concern for hormone secreting tumor/malignancy, and JAK2 is normal which r/o PV. He has polycythemia, although no obvious source except remote smoking history. We recommend to f/up with PCP and donate blood 1-2 times per year. We can see him back as needed in the future. I will also let Dr. Shelia Media know. Patient verbalizes understanding and appreciates the call.   Cira Rue, NP 12/29/2021

## 2021-12-29 NOTE — Telephone Encounter (Signed)
Called pt and informed him.

## 2021-12-31 ENCOUNTER — Encounter: Payer: 59 | Admitting: Physical Therapy

## 2022-01-06 ENCOUNTER — Encounter: Payer: 59 | Admitting: Physical Therapy

## 2022-01-14 ENCOUNTER — Ambulatory Visit
Admission: RE | Admit: 2022-01-14 | Discharge: 2022-01-14 | Disposition: A | Discharge status: Home / Self Care | Payer: 59 | Source: Ambulatory Visit | Attending: Pulmonary Disease | Admitting: Pulmonary Disease

## 2022-01-14 ENCOUNTER — Other Ambulatory Visit: Payer: Self-pay

## 2022-01-14 DIAGNOSIS — R911 Solitary pulmonary nodule: Secondary | ICD-10-CM | POA: Diagnosis not present

## 2022-01-27 NOTE — Progress Notes (Signed)
Ty, please let patient know I have reviewed his CT. No additional follow up ct imaging recommended at this time per guidelines.  ? ?Thanks, ? ?BLI ? ?Garner Nash, DO ?[facility] ?01/27/2022 6:58 PM  ?

## 2022-01-28 ENCOUNTER — Encounter: Payer: Self-pay | Admitting: Family Medicine

## 2022-01-28 ENCOUNTER — Ambulatory Visit (INDEPENDENT_AMBULATORY_CARE_PROVIDER_SITE_OTHER): Payer: 59 | Admitting: Family Medicine

## 2022-01-28 VITALS — BP 110/80 | HR 63 | Ht 67.0 in | Wt 172.0 lb

## 2022-01-28 DIAGNOSIS — M25562 Pain in left knee: Secondary | ICD-10-CM | POA: Diagnosis not present

## 2022-01-28 DIAGNOSIS — M545 Low back pain, unspecified: Secondary | ICD-10-CM

## 2022-01-28 DIAGNOSIS — G8929 Other chronic pain: Secondary | ICD-10-CM

## 2022-01-28 NOTE — Patient Instructions (Addendum)
Good to see you today. ? ?Con't w/ your home exercises.  If you feel you're doing well enough in terms of your knee, you don't need to return to PT but if you feel like you still have some progress to make, then call them to schedule another appt. ? ?Follow-up: as needed ?

## 2022-01-28 NOTE — Progress Notes (Signed)
? ?  I, Wendy Poet, LAT, ATC, am serving as scribe for Dr. Lynne Leader. ? ?Kyle Welch is a 46 y.o. male who presents to [place] at [facility] today for f/u L knee pain and R-sided LBP.  Prior disectomy at [facility] in 2017 at L5-S1. Pt was last seen by Dr. Georgina Snell and was advised to use Voltaren gel for his knee and was referred to PT, completing 5 visits. Today, pt reports that his lower back is feeling good.  His R knee has improved as well but lingers in the background.  He does not have anymore PT sessions scheduled but is working on his HEP. ? ?Dx imaging: 12/03/21 L-spine XR ? 10/27/16 L-spine XR ? 04/20/16 L-spine MRI ? 06/23/15 L-spine MRI ? ?Pertinent review of systems: No fevers or chills ? ?Relevant historical information: Bipolar affective disorder.  Hyperlipidemia. ? ? ?Exam:  ?BP 110/80 (BP Location: Right Arm, Patient Position: Sitting, Cuff Size: Normal)   Pulse 63   Ht '5\' 7"'$  (1.702 m)   Wt 172 lb (78 kg)   SpO2 96%   BMI 26.94 kg/m?  ?General: Well Developed, well nourished, and in no acute distress.  ? ?MSK: Left knee: Normal-appearing normal motion with crepitation. ?L-spine normal lumbar motion. ?Normal gait. ? ? ? ?Lab and Radiology Results ? ?X-ray images of left knee obtained at primary care provider office previously provided by patient on a CD personally independently interpreted. ?Mild medial compartment DJD. ? ? ? ? ?Assessment and Plan: ?46 y.o. male with right knee pain thought to be due to mild DJD and patellofemoral pain syndrome.  Improved with PT and home exercise program. ? ?Back pain improved with PT.  Continue home exercise program. ? ? ? ? ?Discussed warning signs or symptoms. Please see discharge instructions. Patient expresses understanding. ? ? ?The above documentation has been reviewed and is accurate and complete Lynne Leader, M.D. ? ? ?

## 2022-03-10 NOTE — Progress Notes (Signed)
? ?I, Judy Pimple, am serving as a Education administrator for Dr. Lynne Leader. ? ?Kyle Welch is a 46 y.o. male who presents to [place] at [facility] today for f/u of L knee pain due to mild medial compartment DJD per XR from PCP's office.  He was last seen by Dr. Georgina Snell on 01/28/22 for f/u of chronic LBP and L knee pain and noted improvement in his symptoms w/ PT and HEP.  Today, pt reports knee is a lot better still having the pain and depends on the day and the amount of activity he has done that day. Most days manageable but is going out of [place] next [place] and would like an injection before he goes so it does not get really bad while there.  ? ?Patient is going to [employer] next week. ? ?Dx imaging: L knee XR done at PCP's office ?12/03/21 L-spine XR ?            10/27/16 L-spine XR ?            04/20/16 L-spine MRI ?            06/23/15 L-spine MRI ?Pertinent review of systems: No fevers or chills ? ?Relevant historical information: History of bipolar disorder ? ? ?Exam:  ?BP 120/82   Pulse 86   Resp (!) 99   Ht '5\' 7"'$  (1.702 m)   Wt 173 lb (78.5 kg)   BMI 27.10 kg/m?  ?General: Well Developed, well nourished, and in no acute distress.  ? ?MSK: Left knee mild effusion normal-appearing otherwise. ?Normal motion. ? ? ? ? ?Lab and Radiology Results ? ?Procedure: Real-time Ultrasound Guided Injection of left knee superior lateral patellar space ?Device: Philips Affiniti 50G ?Images permanently stored and available for review in PACS ?Verbal informed consent obtained.  Discussed risks and benefits of procedure. Warned about infection, bleeding, hyperglycemia damage to structures among others. ?Patient expresses understanding and agreement ?Time-out conducted.   ?Noted no overlying erythema, induration, or other signs of local infection.   ?Skin prepped in a sterile fashion.   ?Local anesthesia: Topical Ethyl chloride.   ?With sterile technique and under real time ultrasound guidance: 40 mg of  Kenalog and 2 mL of Marcaine injected into knee joint. Fluid seen entering the joint capsule.   ?Completed without difficulty   ?Pain immediately resolved suggesting accurate placement of the medication.   ?Advised to call if fevers/chills, erythema, induration, drainage, or persistent bleeding.   ?Images permanently stored and available for review in the ultrasound unit.  ?Impression: Technically successful ultrasound guided injection. ? ? ? ? ? ? ? ? ?Assessment and Plan: ?46 y.o. male with knee pain thought to be due to DJD with likely degenerative meniscus tear.  Plan for steroid injection today.  Check back as needed.  Continue conservative management strategies including quad strengthening. ? ? ?PDMP not reviewed this encounter. ?Orders Placed This Encounter  ?Procedures  ? [place] LIMITED JOINT SPACE STRUCTURES LOW LEFT(NO LINKED CHARGES)  ?  Order Specific Question:   Reason for Exam (SYMPTOM  OR DIAGNOSIS REQUIRED)  ?  Answer:   L knee pain  ?  Order Specific Question:   Preferred imaging location?  ?  Answer:   [place]  ? ?No orders of the defined types were placed in this encounter. ? ? ? ?Discussed warning signs or symptoms. Please see discharge instructions. Patient expresses understanding. ? ? ?The above documentation has been reviewed and is  accurate and complete Lynne Leader, M.D. ? ? ?

## 2022-03-11 ENCOUNTER — Ambulatory Visit: Payer: Self-pay

## 2022-03-11 ENCOUNTER — Ambulatory Visit: Payer: 59 | Admitting: Family Medicine

## 2022-03-11 VITALS — BP 120/82 | HR 86 | Resp 99 | Ht 67.0 in | Wt 173.0 lb

## 2022-03-11 DIAGNOSIS — G8929 Other chronic pain: Secondary | ICD-10-CM

## 2022-03-11 DIAGNOSIS — M25562 Pain in left knee: Secondary | ICD-10-CM | POA: Diagnosis not present

## 2022-03-11 NOTE — Patient Instructions (Addendum)
Thank you for coming in today.  ? ?You received a steroid injection in your left knee today. Seek immediate medical attention if the joint becomes red, extremely painful, or is oozing fluid.  ? ?Have fun at [facility]!! M-[id number]-K-[id number] M-O-U-S-E! ? ? ?

## 2022-04-30 DIAGNOSIS — R69 Illness, unspecified: Secondary | ICD-10-CM | POA: Diagnosis not present

## 2022-05-12 ENCOUNTER — Ambulatory Visit: Payer: Self-pay | Admitting: General Surgery

## 2022-05-27 ENCOUNTER — Encounter (HOSPITAL_BASED_OUTPATIENT_CLINIC_OR_DEPARTMENT_OTHER): Payer: Self-pay | Admitting: General Surgery

## 2022-05-31 ENCOUNTER — Other Ambulatory Visit: Payer: Self-pay

## 2022-05-31 ENCOUNTER — Encounter (HOSPITAL_BASED_OUTPATIENT_CLINIC_OR_DEPARTMENT_OTHER): Payer: Self-pay | Admitting: General Surgery

## 2022-05-31 DIAGNOSIS — R69 Illness, unspecified: Secondary | ICD-10-CM | POA: Diagnosis not present

## 2022-05-31 DIAGNOSIS — F32A Depression, unspecified: Secondary | ICD-10-CM | POA: Diagnosis not present

## 2022-05-31 DIAGNOSIS — F411 Generalized anxiety disorder: Secondary | ICD-10-CM | POA: Diagnosis not present

## 2022-05-31 NOTE — Progress Notes (Signed)
Spoke w/ via phone for pre-op interview---pt Lab needs dos----  none             Lab results------ COVID test -----patient states asymptomatic no test needed Arrive at -------1245 pm 8-3--2023 NPO after MN NO Solid Food.  Clear liquids from MN until--- Med rec completed Medications to take morning of surgery -----fluoxetine, lamotrigene, hydroxyzine prn Diabetic medication -----n/a Patient instructed no nail polish to be worn day of surgery Patient instructed to bring photo id and insurance card day of surgery Patient aware to have Driver (ride ) / caregiver    wife genny for 24 hours after surgery  Patient Special Instructions -----none Pre-Op special Istructions -----none Patient verbalized understanding of instructions that were given at this phone interview. Patient denies shortness of breath, chest pain, fever, cough at this phone interview.

## 2022-06-03 ENCOUNTER — Other Ambulatory Visit: Payer: Self-pay

## 2022-06-03 ENCOUNTER — Encounter (HOSPITAL_BASED_OUTPATIENT_CLINIC_OR_DEPARTMENT_OTHER)
Admission: RE | Disposition: A | Discharge status: Home / Self Care | Payer: Self-pay | Source: Ambulatory Visit | Attending: General Surgery

## 2022-06-03 ENCOUNTER — Ambulatory Visit (HOSPITAL_BASED_OUTPATIENT_CLINIC_OR_DEPARTMENT_OTHER): Payer: 59 | Admitting: Certified Registered Nurse Anesthetist

## 2022-06-03 ENCOUNTER — Encounter (HOSPITAL_BASED_OUTPATIENT_CLINIC_OR_DEPARTMENT_OTHER): Payer: Self-pay | Admitting: General Surgery

## 2022-06-03 ENCOUNTER — Ambulatory Visit (HOSPITAL_BASED_OUTPATIENT_CLINIC_OR_DEPARTMENT_OTHER)
Admission: RE | Admit: 2022-06-03 | Discharge: 2022-06-03 | Disposition: A | Discharge status: Home / Self Care | Payer: 59 | Source: Ambulatory Visit | Attending: General Surgery | Admitting: General Surgery

## 2022-06-03 DIAGNOSIS — F32A Depression, unspecified: Secondary | ICD-10-CM | POA: Insufficient documentation

## 2022-06-03 DIAGNOSIS — Z87891 Personal history of nicotine dependence: Secondary | ICD-10-CM | POA: Diagnosis not present

## 2022-06-03 DIAGNOSIS — K4090 Unilateral inguinal hernia, without obstruction or gangrene, not specified as recurrent: Secondary | ICD-10-CM | POA: Diagnosis not present

## 2022-06-03 DIAGNOSIS — F419 Anxiety disorder, unspecified: Secondary | ICD-10-CM | POA: Insufficient documentation

## 2022-06-03 DIAGNOSIS — Z01818 Encounter for other preprocedural examination: Secondary | ICD-10-CM

## 2022-06-03 DIAGNOSIS — G8918 Other acute postprocedural pain: Secondary | ICD-10-CM | POA: Diagnosis not present

## 2022-06-03 HISTORY — PX: INGUINAL HERNIA REPAIR: SHX194

## 2022-06-03 HISTORY — DX: Depression, unspecified: F32.A

## 2022-06-03 HISTORY — DX: Other chronic pain: G89.29

## 2022-06-03 HISTORY — DX: Low back pain, unspecified: M54.50

## 2022-06-03 HISTORY — DX: Generalized anxiety disorder: F41.1

## 2022-06-03 HISTORY — DX: Other complications of anesthesia, initial encounter: T88.59XA

## 2022-06-03 HISTORY — DX: Presence of spectacles and contact lenses: Z97.3

## 2022-06-03 HISTORY — DX: Solitary pulmonary nodule: R91.1

## 2022-06-03 SURGERY — REPAIR, HERNIA, INGUINAL, ADULT
Anesthesia: Regional | Site: Inguinal | Laterality: Right

## 2022-06-03 MED ORDER — PHENYLEPHRINE 80 MCG/ML (10ML) SYRINGE FOR IV PUSH (FOR BLOOD PRESSURE SUPPORT)
PREFILLED_SYRINGE | INTRAVENOUS | Status: AC
  Filled 2022-06-03: qty 20

## 2022-06-03 MED ORDER — ACETAMINOPHEN 500 MG PO TABS
ORAL_TABLET | ORAL | Status: AC
  Filled 2022-06-03: qty 2

## 2022-06-03 MED ORDER — CELECOXIB 200 MG PO CAPS
ORAL_CAPSULE | ORAL | Status: AC
  Filled 2022-06-03: qty 2

## 2022-06-03 MED ORDER — PHENYLEPHRINE 80 MCG/ML (10ML) SYRINGE FOR IV PUSH (FOR BLOOD PRESSURE SUPPORT)
PREFILLED_SYRINGE | INTRAVENOUS | Status: DC | PRN
  Administered 2022-06-03: 80 ug via INTRAVENOUS

## 2022-06-03 MED ORDER — MIDAZOLAM HCL 2 MG/2ML IJ SOLN
INTRAMUSCULAR | Status: AC
  Filled 2022-06-03: qty 2

## 2022-06-03 MED ORDER — LACTATED RINGERS IV SOLN: INTRAVENOUS | Status: DC

## 2022-06-03 MED ORDER — ACETAMINOPHEN 500 MG PO TABS: 1000.0000 mg | ORAL_TABLET | ORAL | Status: DC

## 2022-06-03 MED ORDER — ROPIVACAINE HCL 5 MG/ML IJ SOLN
INTRAMUSCULAR | Status: DC | PRN
  Administered 2022-06-03: 30 mL via PERINEURAL

## 2022-06-03 MED ORDER — PROPOFOL 10 MG/ML IV BOLUS
INTRAVENOUS | Status: DC | PRN
  Administered 2022-06-03: 50 mg via INTRAVENOUS
  Administered 2022-06-03: 200 mg via INTRAVENOUS
  Administered 2022-06-03: 30 mg via INTRAVENOUS

## 2022-06-03 MED ORDER — DEXMEDETOMIDINE (PRECEDEX) IN NS 20 MCG/5ML (4 MCG/ML) IV SYRINGE
PREFILLED_SYRINGE | INTRAVENOUS | Status: DC | PRN
  Administered 2022-06-03 (×2): 8 ug via INTRAVENOUS
  Administered 2022-06-03: 4 ug via INTRAVENOUS

## 2022-06-03 MED ORDER — MIDAZOLAM HCL 2 MG/2ML IJ SOLN
2.0000 mg | Freq: Once | INTRAMUSCULAR | Status: AC
  Administered 2022-06-03: 2 mg via INTRAVENOUS

## 2022-06-03 MED ORDER — FENTANYL CITRATE (PF) 100 MCG/2ML IJ SOLN: 25.0000 ug | INTRAMUSCULAR | Status: DC | PRN

## 2022-06-03 MED ORDER — IBUPROFEN 800 MG PO TABS: 800.0000 mg | ORAL_TABLET | Freq: Three times a day (TID) | ORAL | 0 refills | Status: DC | PRN

## 2022-06-03 MED ORDER — CHLORHEXIDINE GLUCONATE CLOTH 2 % EX PADS: 6.0000 | MEDICATED_PAD | Freq: Once | CUTANEOUS | Status: DC

## 2022-06-03 MED ORDER — LIDOCAINE HCL (PF) 2 % IJ SOLN
INTRAMUSCULAR | Status: AC
  Filled 2022-06-03: qty 5

## 2022-06-03 MED ORDER — FENTANYL CITRATE (PF) 100 MCG/2ML IJ SOLN
INTRAMUSCULAR | Status: AC
  Filled 2022-06-03: qty 2

## 2022-06-03 MED ORDER — EPHEDRINE 5 MG/ML INJ
INTRAVENOUS | Status: AC
  Filled 2022-06-03: qty 10

## 2022-06-03 MED ORDER — CEFAZOLIN SODIUM-DEXTROSE 2-4 GM/100ML-% IV SOLN
2.0000 g | INTRAVENOUS | Status: AC
  Administered 2022-06-03: 2 g via INTRAVENOUS

## 2022-06-03 MED ORDER — FENTANYL CITRATE (PF) 100 MCG/2ML IJ SOLN
100.0000 ug | Freq: Once | INTRAMUSCULAR | Status: AC
  Administered 2022-06-03: 100 ug via INTRAVENOUS

## 2022-06-03 MED ORDER — 0.9 % SODIUM CHLORIDE (POUR BTL) OPTIME
TOPICAL | Status: DC | PRN
  Administered 2022-06-03: 500 mL

## 2022-06-03 MED ORDER — ENSURE PRE-SURGERY PO LIQD: 296.0000 mL | Freq: Once | ORAL | Status: DC

## 2022-06-03 MED ORDER — CELECOXIB 200 MG PO CAPS
400.0000 mg | ORAL_CAPSULE | ORAL | Status: AC
  Administered 2022-06-03: 400 mg via ORAL

## 2022-06-03 MED ORDER — FENTANYL CITRATE (PF) 100 MCG/2ML IJ SOLN
INTRAMUSCULAR | Status: DC | PRN
Start: 2022-06-03 — End: 2022-06-03
  Administered 2022-06-03 (×2): 50 ug via INTRAVENOUS

## 2022-06-03 MED ORDER — ACETAMINOPHEN 500 MG PO TABS: 1000.0000 mg | ORAL_TABLET | Freq: Once | ORAL | Status: DC

## 2022-06-03 MED ORDER — LIDOCAINE 2% (20 MG/ML) 5 ML SYRINGE
INTRAMUSCULAR | Status: DC | PRN
  Administered 2022-06-03: 40 mg via INTRAVENOUS

## 2022-06-03 MED ORDER — ONDANSETRON HCL 4 MG/2ML IJ SOLN
INTRAMUSCULAR | Status: DC | PRN
  Administered 2022-06-03: 4 mg via INTRAVENOUS

## 2022-06-03 MED ORDER — EPHEDRINE SULFATE-NACL 50-0.9 MG/10ML-% IV SOSY
PREFILLED_SYRINGE | INTRAVENOUS | Status: DC | PRN
  Administered 2022-06-03: 10 mg via INTRAVENOUS
  Administered 2022-06-03: 5 mg via INTRAVENOUS
  Administered 2022-06-03: 10 mg via INTRAVENOUS

## 2022-06-03 MED ORDER — DEXAMETHASONE SODIUM PHOSPHATE 10 MG/ML IJ SOLN
INTRAMUSCULAR | Status: DC | PRN
  Administered 2022-06-03: 10 mg via INTRAVENOUS

## 2022-06-03 MED ORDER — CEFAZOLIN SODIUM-DEXTROSE 2-4 GM/100ML-% IV SOLN
INTRAVENOUS | Status: AC
  Filled 2022-06-03: qty 100

## 2022-06-03 MED ORDER — DEXAMETHASONE SODIUM PHOSPHATE 10 MG/ML IJ SOLN
INTRAMUSCULAR | Status: DC | PRN
  Administered 2022-06-03: 5 mg

## 2022-06-03 MED ORDER — MIDAZOLAM HCL 5 MG/5ML IJ SOLN
INTRAMUSCULAR | Status: DC | PRN
  Administered 2022-06-03: 2 mg via INTRAVENOUS

## 2022-06-03 MED ORDER — OXYCODONE HCL 5 MG PO TABS: 5.0000 mg | ORAL_TABLET | Freq: Four times a day (QID) | ORAL | 0 refills | Status: DC | PRN

## 2022-06-03 MED ORDER — PROPOFOL 10 MG/ML IV BOLUS
INTRAVENOUS | Status: AC
  Filled 2022-06-03: qty 20

## 2022-06-03 SURGICAL SUPPLY — 47 items
ADH SKN CLS APL DERMABOND .7 (GAUZE/BANDAGES/DRESSINGS) ×1
APL PRP STRL LF DISP 70% ISPRP (MISCELLANEOUS) ×1
BLADE CLIPPER SENSICLIP SURGIC (BLADE) ×2 IMPLANT
BLADE SURG 15 STRL LF DISP TIS (BLADE) ×1 IMPLANT
BLADE SURG 15 STRL SS (BLADE) ×2
CHLORAPREP W/TINT 26 (MISCELLANEOUS) ×2 IMPLANT
COVER BACK TABLE 60X90IN (DRAPES) ×2 IMPLANT
COVER MAYO STAND STRL (DRAPES) ×2 IMPLANT
DERMABOND ADVANCED (GAUZE/BANDAGES/DRESSINGS) ×2
DERMABOND ADVANCED .7 DNX12 (GAUZE/BANDAGES/DRESSINGS) ×1 IMPLANT
DRAIN PENROSE 0.25X18 (DRAIN) ×1 IMPLANT
DRAPE LAPAROSCOPIC ABDOMINAL (DRAPES) ×2 IMPLANT
DRAPE UTILITY XL STRL (DRAPES) ×2 IMPLANT
ELECT REM PT RETURN 9FT ADLT (ELECTROSURGICAL) ×2
ELECTRODE REM PT RTRN 9FT ADLT (ELECTROSURGICAL) ×1 IMPLANT
GAUZE 4X4 16PLY ~~LOC~~+RFID DBL (SPONGE) ×2 IMPLANT
GLOVE BIO SURGEON STRL SZ 6.5 (GLOVE) ×1 IMPLANT
GLOVE BIOGEL PI IND STRL 6.5 (GLOVE) IMPLANT
GLOVE BIOGEL PI IND STRL 7.0 (GLOVE) ×1 IMPLANT
GLOVE BIOGEL PI INDICATOR 6.5 (GLOVE) ×4
GLOVE BIOGEL PI INDICATOR 7.0 (GLOVE) ×6
GLOVE ECLIPSE 6.5 STRL STRAW (GLOVE) ×2 IMPLANT
GLOVE SURG SS PI 7.0 STRL IVOR (GLOVE) ×3 IMPLANT
GOWN STRL REUS W/TWL LRG LVL3 (GOWN DISPOSABLE) ×5 IMPLANT
KIT TURNOVER CYSTO (KITS) ×2 IMPLANT
MANIFOLD NEPTUNE II (INSTRUMENTS) ×1 IMPLANT
MESH ULTRAPRO 3X6 7.6X15CM (Mesh General) ×1 IMPLANT
NDL HYPO 25X1 1.5 SAFETY (NEEDLE) ×1 IMPLANT
NEEDLE HYPO 25X1 1.5 SAFETY (NEEDLE) ×2 IMPLANT
NS IRRIG 500ML POUR BTL (IV SOLUTION) ×2 IMPLANT
PACK BASIN DAY SURGERY FS (CUSTOM PROCEDURE TRAY) ×2 IMPLANT
PAD ARMBOARD 7.5X6 YLW CONV (MISCELLANEOUS) ×2 IMPLANT
PENCIL SMOKE EVACUATOR (MISCELLANEOUS) ×2 IMPLANT
SUT MNCRL AB 4-0 PS2 18 (SUTURE) ×2 IMPLANT
SUT PROLENE 2 0 CT2 30 (SUTURE) ×6 IMPLANT
SUT VIC AB 2-0 SH 18 (SUTURE) ×2 IMPLANT
SUT VIC AB 2-0 SH 27 (SUTURE) ×2
SUT VIC AB 2-0 SH 27XBRD (SUTURE) ×1 IMPLANT
SUT VIC AB 3-0 SH 27 (SUTURE) ×2
SUT VIC AB 3-0 SH 27X BRD (SUTURE) ×1 IMPLANT
SUT VICRYL 2 0 18  UND BR (SUTURE) ×2
SUT VICRYL 2 0 18 UND BR (SUTURE) ×1 IMPLANT
SYR BULB IRRIG 60ML STRL (SYRINGE) ×2 IMPLANT
SYR CONTROL 10ML LL (SYRINGE) ×2 IMPLANT
TOWEL OR 17X26 10 PK STRL BLUE (TOWEL DISPOSABLE) ×2 IMPLANT
TUBE CONNECTING 12X1/4 (SUCTIONS) ×2 IMPLANT
YANKAUER SUCT BULB TIP NO VENT (SUCTIONS) ×2 IMPLANT

## 2022-06-03 NOTE — Anesthesia Preprocedure Evaluation (Addendum)
Anesthesia Evaluation  Patient identified by MRN, date of birth, ID band Patient awake    Reviewed: Allergy & Precautions, NPO status , Patient's Chart, lab work & pertinent test results  Airway Mallampati: II  TM Distance: >3 FB Neck ROM: Full    Dental no notable dental hx. (+) Teeth Intact, Dental Advisory Given   Pulmonary neg pulmonary ROS, former smoker,    Pulmonary exam normal breath sounds clear to auscultation       Cardiovascular negative cardio ROS Normal cardiovascular exam Rhythm:Regular Rate:Normal     Neuro/Psych PSYCHIATRIC DISORDERS Anxiety Depression Bipolar Disorder negative neurological ROS     GI/Hepatic negative GI ROS, (+)     substance abuse  alcohol use,   Endo/Other  negative endocrine ROS  Renal/GU negative Renal ROS  negative genitourinary   Musculoskeletal negative musculoskeletal ROS (+)   Abdominal   Peds  Hematology negative hematology ROS (+)   Anesthesia Other Findings   Reproductive/Obstetrics                            Anesthesia Physical Anesthesia Plan  ASA: 2  Anesthesia Plan: General and Regional   Post-op Pain Management: Tylenol PO (pre-op)* and Regional block*   Induction: Intravenous  PONV Risk Score and Plan: 2 and Midazolam, Dexamethasone and Ondansetron  Airway Management Planned: Oral ETT  Additional Equipment:   Intra-op Plan:   Post-operative Plan: Extubation in OR  Informed Consent: I have reviewed the patients History and Physical, chart, labs and discussed the procedure including the risks, benefits and alternatives for the proposed anesthesia with the patient or authorized representative who has indicated his/her understanding and acceptance.     Dental advisory given  Plan Discussed with: CRNA  Anesthesia Plan Comments:         Anesthesia Quick Evaluation

## 2022-06-03 NOTE — Transfer of Care (Signed)
Immediate Anesthesia Transfer of Care Note  Patient: Kyle Welch  Procedure(s) Performed: OPEN RIGHT INGUINAL HERNIA REPAIR WITH MESH (Right: Inguinal)  Patient Location: PACU  Anesthesia Type:General  Level of Consciousness: awake, alert , oriented and patient cooperative  Airway & Oxygen Therapy: Patient Spontanous Breathing  Post-op Assessment: Report given to RN and Post -op Vital signs reviewed and stable  Post vital signs: Reviewed and stable  Last Vitals:  Vitals Value Taken Time  BP 153/92 06/03/22 1613  Temp    Pulse 78 06/03/22 1615  Resp 15 06/03/22 1615  SpO2 96 % 06/03/22 1615  Vitals shown include unvalidated device data.  Last Pain:  Vitals:   06/03/22 1307  TempSrc: Oral  PainSc: 3       Patients Stated Pain Goal: 4 (81/44/81 8563)  Complications: No notable events documented.

## 2022-06-03 NOTE — Op Note (Signed)
Preop diagnosis: right inguinal hernia  Postop diagnosis: right direct inguinal hernia  Procedure: open Right inguinal hernia repair with mesh  Surgeon: Gurney Maxin, M.D.  Asst: Gwynn Burly, M.D.  Anesthesia: Gen.   Indications for procedure: Kyle Welch is a 46 y.o. male with symptoms of pain and enlarging Right inguinal hernia(s). After discussing risks, alternatives and benefits he decided on open repair and was brought to day surgery for repair.  Description of procedure: The patient was brought into the operative suite, placed supine. Anesthesia was administered with endotracheal tube. Patient was strapped in place. The patient was prepped and draped in the usual sterile fashion.  The anterior superior iliac spine and pubic tubercle were identified on the Right side. An incision was made 1cm above the connecting line, representative of the location of the inguinal ligament. The subcutaneous tissue was bluntly dissected, scarpa's fascia was dissected away. The external abdominal oblique fascia was identified and sharply opened down to the external inguinal ring. The conjoint tendon and inguinal ligament were identified. The cord structures and sac were dissected free of the surrounding tissue in 360 degrees. A penrose drain was used to encircle the contents. The cremasteric fibers were dissected free of the contents of the cord and hernia sac. The cord structures (vessels and vas deferens) were identified and carefully dissected away from the hernia sac. The hernia sac was reduced and contained no visceral structures.The hernia sac was dissected down to the internal inguinal ring. Preperitoneal fat was identified showing appropriate dissection. The sac was then reduced into the preperitoneal space. The hernia was direct. A 3x6 Ultrapro mesh was then used to close the defect and reinforce the floor. The mesh was sutured to the lacunar ligament and inguinal ligament using a 2-0 prolene in  running fashion. Next the superior edge of the mesh was sutured to the conjoined tendon using a 2-0 running Prolene. An additional 2-0 Prolene was used to suture the tail ends of the mesh together re-creating the deep ring. Cord structures are running in a neutral position through the mesh. Next the external abdominal oblique fascia was closed with a 2-0 Vicryl in interrupted fashion to re-create the external inguinal ring. Scarpa's fascia was closed with 3-0 Vicryl in running fashion. Skin was closed with a 4-0 Monocryl subcuticular stitch in running fashion. Dermabond place for dressing. Patient woke from anesthesia and brought to PACU in stable condition. All counts are correct.  Findings: right direct inguinal hernia  Specimen: none  Blood loss: 20 ml  Local anesthesia: none  Complications: none  Implant: 3 x 6 Ultrapro mesh  Gurney Maxin, M.D. General, Bariatric, & Minimally Invasive Surgery [facility], [place] 4:05 PM 06/03/2022

## 2022-06-03 NOTE — Anesthesia Procedure Notes (Signed)
Procedure Name: LMA Insertion Date/Time: 06/03/2022 3:04 PM  Performed by: Rogers Blocker, CRNAPre-anesthesia Checklist: Patient identified, Emergency Drugs available, Suction available and Patient being monitored Patient Re-evaluated:Patient Re-evaluated prior to induction Oxygen Delivery Method: Circle System Utilized Preoxygenation: Pre-oxygenation with 100% oxygen Induction Type: IV induction Ventilation: Mask ventilation without difficulty LMA: LMA inserted LMA Size: 4.0 Number of attempts: 1 Placement Confirmation: positive ETCO2 Tube secured with: Tape Dental Injury: Teeth and Oropharynx as per pre-operative assessment

## 2022-06-03 NOTE — Anesthesia Procedure Notes (Signed)
Anesthesia Regional Block: Quadratus lumborum   Pre-Anesthetic Checklist: , timeout performed,  Correct Patient, Correct Site, Correct Laterality,  Correct Procedure, Correct Position, site marked,  Risks and benefits discussed,  Pre-op evaluation,  At surgeon's request and post-op pain management  Laterality: Right  Prep: Maximum Sterile Barrier Precautions used, chloraprep       Needles:  Injection technique: Single-shot  Needle Type: Echogenic Stimulator Needle     Needle Length: 9cm  Needle Gauge: 21     Additional Needles:   Procedures:,,,, ultrasound used (permanent image in chart),,    Narrative:  Start time: 06/03/2022 2:30 PM End time: 06/03/2022 2:33 PM Injection made incrementally with aspirations every 5 mL. Anesthesiologist: Freddrick March, MD

## 2022-06-03 NOTE — Progress Notes (Signed)
Assisted Dr. Woodrum with right, transabdominal plane, ultrasound guided block. Side rails up, monitors on throughout procedure. See vital signs in flow sheet. Tolerated Procedure well. 

## 2022-06-03 NOTE — H&P (Signed)
Chief Complaint: Hernia   History of Present Illness: Kyle Welch is a 46 y.o. male who is seen today as an office consultation at the request of Dr. Shelia Media for evaluation of Hernia .   He first noticed the hernia on the right 10 years ago. It has slowly gotten larger. Symptoms are soreness with exercise. He denies nausea or vomiting or bowel habit change.  He does not smoke He does not have diabetes He had a previous left open inguinal hernia repair  Review of Systems: A complete review of systems was obtained from the patient. I have reviewed this information and discussed as appropriate with the patient. See HPI as well for other ROS.  Review of Systems  Constitutional: Negative.  HENT: Negative.  Eyes: Negative.  Respiratory: Negative.  Cardiovascular: Negative.  Gastrointestinal: Negative.  Genitourinary: Negative.  Musculoskeletal: Negative.  Skin: Negative.  Neurological: Negative.  Endo/Heme/Allergies: Negative.  Psychiatric/Behavioral: Negative.    Medical History: Past Medical History:  Diagnosis Date   Anxiety   There is no problem list on file for this patient.  Past Surgical History:  Procedure Laterality Date   HERNIA REPAIR    Allergies  Allergen Reactions   Codeine Itching and Rash   Current Outpatient Medications on File Prior to Visit  Medication Sig Dispense Refill   clonazePAM (KLONOPIN) 0.5 MG tablet Take 1 tablet by mouth 2 (two) times daily   FLUoxetine (PROZAC) 20 MG capsule Take 20 mg by mouth once daily   lamoTRIgine (LAMICTAL) 100 MG tablet 1 1/2 TABLET ORALLY ONCE A DAY 30 DAYS   No current facility-administered medications on file prior to visit.   History reviewed. No pertinent family history.   Social History   Tobacco Use  Smoking Status Former   Types: Cigarettes  Smokeless Tobacco Never    Social History   Socioeconomic History   Marital status: Married  Tobacco Use   Smoking status: Former  Types: Cigarettes    Smokeless tobacco: Never  Vaping Use   Vaping Use: Never used  Substance and Sexual Activity   Alcohol use: Never   Drug use: Never   Objective:   Vitals:  12/18/21 1111  BP: 118/80  Pulse: 98  Temp: 36.6 C (97.8 F)  SpO2: 97%  Weight: 78 kg (172 lb)  Height: 171.5 cm (5' 7.5")   Body mass index is 26.54 kg/m.  Physical Exam Constitutional:  Appearance: Normal appearance.  HENT:  Head: Normocephalic and atraumatic.  Pulmonary:  Effort: Pulmonary effort is normal.  Abdominal:  Comments: Moderate right inguinal hernia, no palpable left inguinal hernia  Musculoskeletal:  General: Normal range of motion.  Cervical back: Normal range of motion.  Neurological:  General: No focal deficit present.  Mental Status: He is alert and oriented to person, place, and time. Mental status is at baseline.  Psychiatric:  Mood and Affect: Mood normal.  Behavior: Behavior normal.  Thought Content: Thought content normal.    Labs, Imaging and Diagnostic Testing: I reviewed notes by Deland Pretty  Assessment and Plan:  There are no diagnoses linked to this encounter.   We discussed etiology of hernias and how they can cause pain. We discussed options for inguinal hernia repair vs observation. We discussed details of the surgery of general anesthesia, surgical approach and incisions, dissecting the sack away from vas deference, testicular vessels and nerves and placement of mesh. We discussed risks of bleeding, infection, recurrence, injury to vas deference, testicular vessels, nerve injury, and chronic pain. He  showed good understanding and wanted proceed with open right inguinal hernia repair as outpatient.

## 2022-06-03 NOTE — Discharge Instructions (Addendum)
[facility] _______[facility]  UMBILICAL OR INGUINAL HERNIA REPAIR: POST OP INSTRUCTIONS  Always review your discharge instruction sheet given to you by the facility where your surgery was performed. IF YOU HAVE DISABILITY OR FAMILY LEAVE FORMS, YOU MUST BRING THEM TO THE OFFICE FOR PROCESSING.   DO NOT GIVE THEM TO YOUR DOCTOR.  1. A  prescription for pain medication may be given to you upon discharge.  Take your pain medication as prescribed, if needed.  If narcotic pain medicine is not needed, then you may take acetaminophen (Tylenol) or ibuprofen (Advil) as needed. 2. Take your usually prescribed medications unless otherwise directed. If you need a refill on your pain medication, please contact your pharmacy.  They will contact our office to request authorization. Prescriptions will not be filled after 5 pm or on week-ends. 3. You should follow a light diet the first 24 hours after arrival home, such as soup and crackers, etc.  Be sure to include lots of fluids daily.  Resume your normal diet the day after surgery. 4.Most patients will experience some swelling and bruising around the umbilicus or in the groin and scrotum.  Ice packs and reclining will help.  Swelling and bruising can take several days to resolve.  6. It is common to experience some constipation if taking pain medication after surgery.  Increasing fluid intake and taking a stool softener (such as Colace) will usually help or prevent this problem from occurring.  A mild laxative (Milk of Magnesia or Miralax) should be taken according to package directions if there are no bowel movements after 48 hours. 7. Unless discharge instructions indicate otherwise, you may remove your bandages 24-48 hours after surgery, and you may shower at that time.  You may have steri-strips (small skin tapes) in place directly over the incision.  These strips should be left on the skin for 7-10 days.  If your surgeon used skin glue on the  incision, you may shower in 24 hours.  The glue will flake off over the next 2-3 weeks.  Any sutures or staples will be removed at the office during your follow-up visit. 8. ACTIVITIES:  You may resume regular (light) daily activities beginning the next day--such as daily self-care, walking, climbing stairs--gradually increasing activities as tolerated.  You may have sexual intercourse when it is comfortable.  Refrain from any heavy lifting or straining until approved by your doctor.  a.You may drive when you are no longer taking prescription pain medication, you can comfortably wear a seatbelt, and you can safely maneuver your car and apply brakes. b.RETURN TO WORK:   _____________________________________________  9.You should see your doctor in the office for a follow-up appointment approximately 2-3 weeks after your surgery.  Make sure that you call for this appointment within a day or two after you arrive home to insure a convenient appointment time. 10.OTHER INSTRUCTIONS: _________________________    _____________________________________  WHEN TO CALL YOUR DOCTOR: Fever over 101.0 Inability to urinate Nausea and/or vomiting Extreme swelling or bruising Continued bleeding from incision. Increased pain, redness, or drainage from the incision  The clinic staff is available to answer your questions during regular business hours.  Please don't hesitate to call and ask to speak to one of the nurses for clinical concerns.  If you have a medical emergency, go to the nearest emergency room or call 911.  A surgeon from [facility] is always on call at the hospital   [address], [address],   [place], [place]  [address] ?  [address], [place], [place]   [address] [phone] ? [phone] ? FAX [phone] Web site: [email or web address]   Post Anesthesia Home Care Instructions  Activity: Get plenty of rest for the remainder of the day. A responsible  individual must stay with you for 24 hours following the procedure.  For the next 24 hours, DO NOT: -Drive a car -Paediatric nurse -Drink alcoholic beverages -Take any medication unless instructed by your physician -Make any legal decisions or sign important papers.  Meals: Start with liquid foods such as gelatin or soup. Progress to regular foods as tolerated. Avoid greasy, spicy, heavy foods. If nausea and/or vomiting occur, drink only clear liquids until the nausea and/or vomiting subsides. Call your physician if vomiting continues.  Special Instructions/Symptoms: Your throat may feel dry or sore from the anesthesia or the breathing tube placed in your throat during surgery. If this causes discomfort, gargle with warm salt water. The discomfort should disappear within 24 hours.

## 2022-06-03 NOTE — Anesthesia Postprocedure Evaluation (Signed)
Anesthesia Post Note  Patient: Kyle Welch  Procedure(s) Performed: OPEN RIGHT INGUINAL HERNIA REPAIR WITH MESH (Right: Inguinal)     Patient location during evaluation: PACU Anesthesia Type: Regional and General Level of consciousness: awake and alert Pain management: pain level controlled Vital Signs Assessment: post-procedure vital signs reviewed and stable Respiratory status: spontaneous breathing, nonlabored ventilation, respiratory function stable and patient connected to nasal cannula oxygen Cardiovascular status: blood pressure returned to baseline and stable Postop Assessment: no apparent nausea or vomiting Anesthetic complications: no   No notable events documented.  Last Vitals:  Vitals:   06/03/22 1645 06/03/22 1700  BP: 131/81 123/84  Pulse: 81 82  Resp: (!) 7 15  Temp:  36.7 C  SpO2: 96% 97%    Last Pain:  Vitals:   06/03/22 1630  TempSrc:   PainSc: 2                  Anaalicia Reimann L Shamarion Coots

## 2022-06-04 ENCOUNTER — Encounter (HOSPITAL_BASED_OUTPATIENT_CLINIC_OR_DEPARTMENT_OTHER): Payer: Self-pay | Admitting: General Surgery

## 2022-07-01 ENCOUNTER — Encounter (HOSPITAL_BASED_OUTPATIENT_CLINIC_OR_DEPARTMENT_OTHER): Payer: Self-pay | Admitting: General Surgery

## 2022-07-01 DIAGNOSIS — F32A Depression, unspecified: Secondary | ICD-10-CM | POA: Diagnosis not present

## 2022-07-01 DIAGNOSIS — R69 Illness, unspecified: Secondary | ICD-10-CM | POA: Diagnosis not present

## 2022-07-01 DIAGNOSIS — F411 Generalized anxiety disorder: Secondary | ICD-10-CM | POA: Diagnosis not present

## 2022-07-31 DIAGNOSIS — F411 Generalized anxiety disorder: Secondary | ICD-10-CM | POA: Diagnosis not present

## 2022-07-31 DIAGNOSIS — R69 Illness, unspecified: Secondary | ICD-10-CM | POA: Diagnosis not present

## 2022-07-31 DIAGNOSIS — F1020 Alcohol dependence, uncomplicated: Secondary | ICD-10-CM | POA: Diagnosis not present

## 2022-07-31 DIAGNOSIS — F32A Depression, unspecified: Secondary | ICD-10-CM | POA: Diagnosis not present

## 2022-08-11 DIAGNOSIS — Z1211 Encounter for screening for malignant neoplasm of colon: Secondary | ICD-10-CM | POA: Diagnosis not present

## 2022-08-31 DIAGNOSIS — F411 Generalized anxiety disorder: Secondary | ICD-10-CM | POA: Diagnosis not present

## 2022-08-31 DIAGNOSIS — F32A Depression, unspecified: Secondary | ICD-10-CM | POA: Diagnosis not present

## 2022-08-31 DIAGNOSIS — F1020 Alcohol dependence, uncomplicated: Secondary | ICD-10-CM | POA: Diagnosis not present

## 2022-09-16 DIAGNOSIS — K635 Polyp of colon: Secondary | ICD-10-CM | POA: Diagnosis not present

## 2022-09-16 DIAGNOSIS — K5730 Diverticulosis of large intestine without perforation or abscess without bleeding: Secondary | ICD-10-CM | POA: Diagnosis not present

## 2022-09-16 DIAGNOSIS — D125 Benign neoplasm of sigmoid colon: Secondary | ICD-10-CM | POA: Diagnosis not present

## 2022-09-16 DIAGNOSIS — Z1211 Encounter for screening for malignant neoplasm of colon: Secondary | ICD-10-CM | POA: Diagnosis not present

## 2022-09-30 DIAGNOSIS — F32A Depression, unspecified: Secondary | ICD-10-CM | POA: Diagnosis not present

## 2022-09-30 DIAGNOSIS — F411 Generalized anxiety disorder: Secondary | ICD-10-CM | POA: Diagnosis not present

## 2022-09-30 DIAGNOSIS — R69 Illness, unspecified: Secondary | ICD-10-CM | POA: Diagnosis not present

## 2023-01-04 DIAGNOSIS — Z0000 Encounter for general adult medical examination without abnormal findings: Secondary | ICD-10-CM | POA: Diagnosis not present

## 2023-03-31 IMAGING — DX DG LUMBAR SPINE COMPLETE 4+V
5 series · 5 of 5 positions shown · non-contrast
Comparison: X-ray 10/27/2016; MRI 04/20/2016.

CLINICAL DATA: Right-sided low back pain for six months.

EXAM:
LUMBAR SPINE - COMPLETE 4+ VIEW

[l-spine ap]
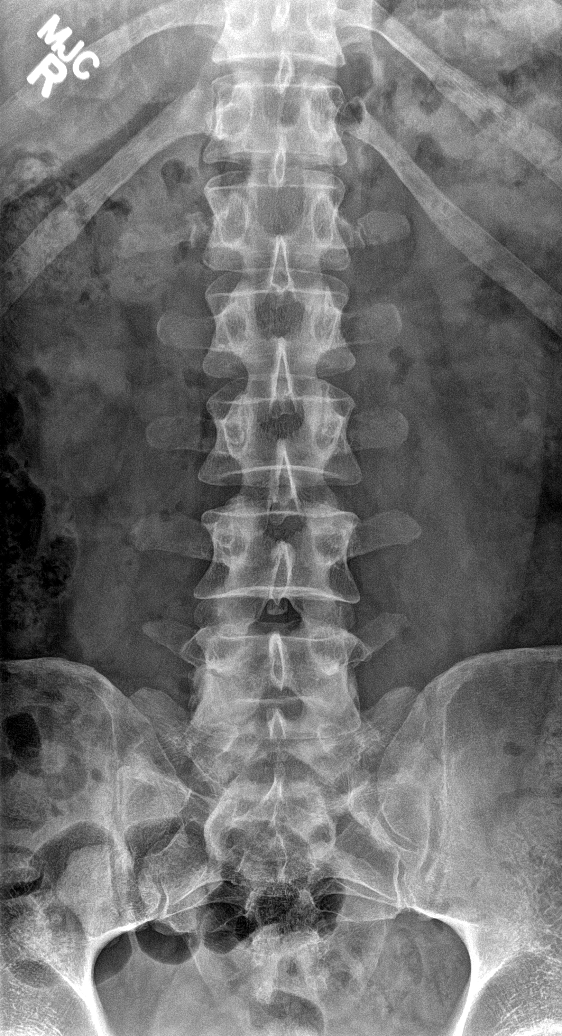

[l-spine obl (1 of 2)]
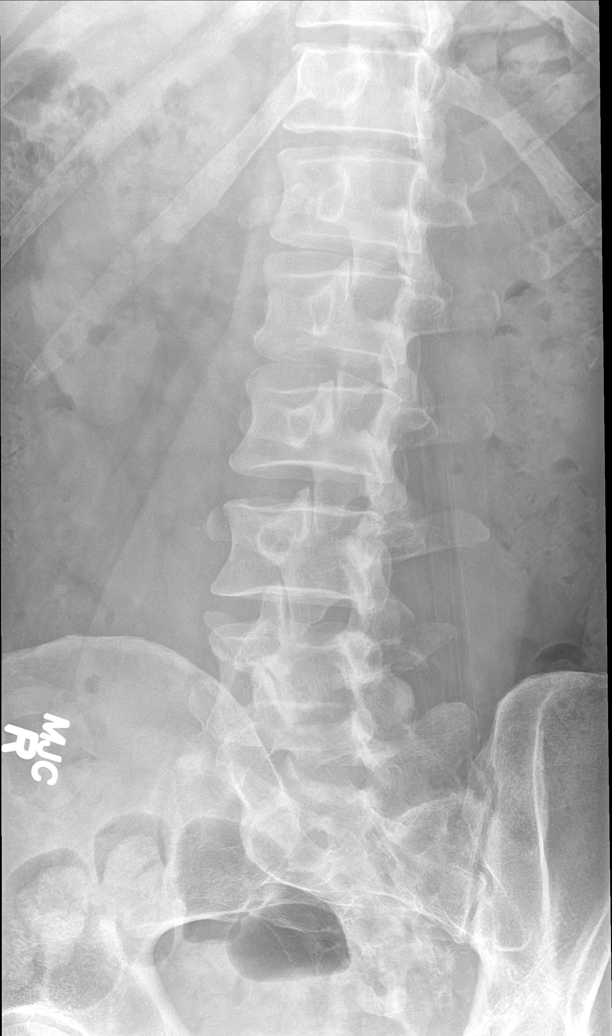

[l-spine obl (2 of 2)]
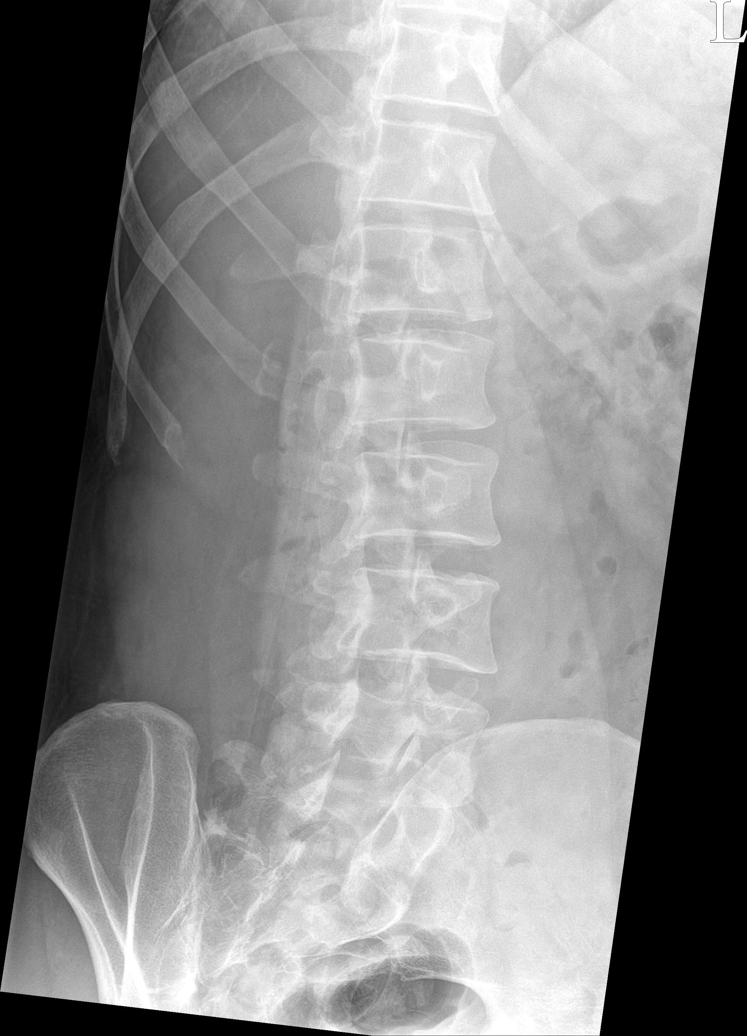

[l-spine lateral]
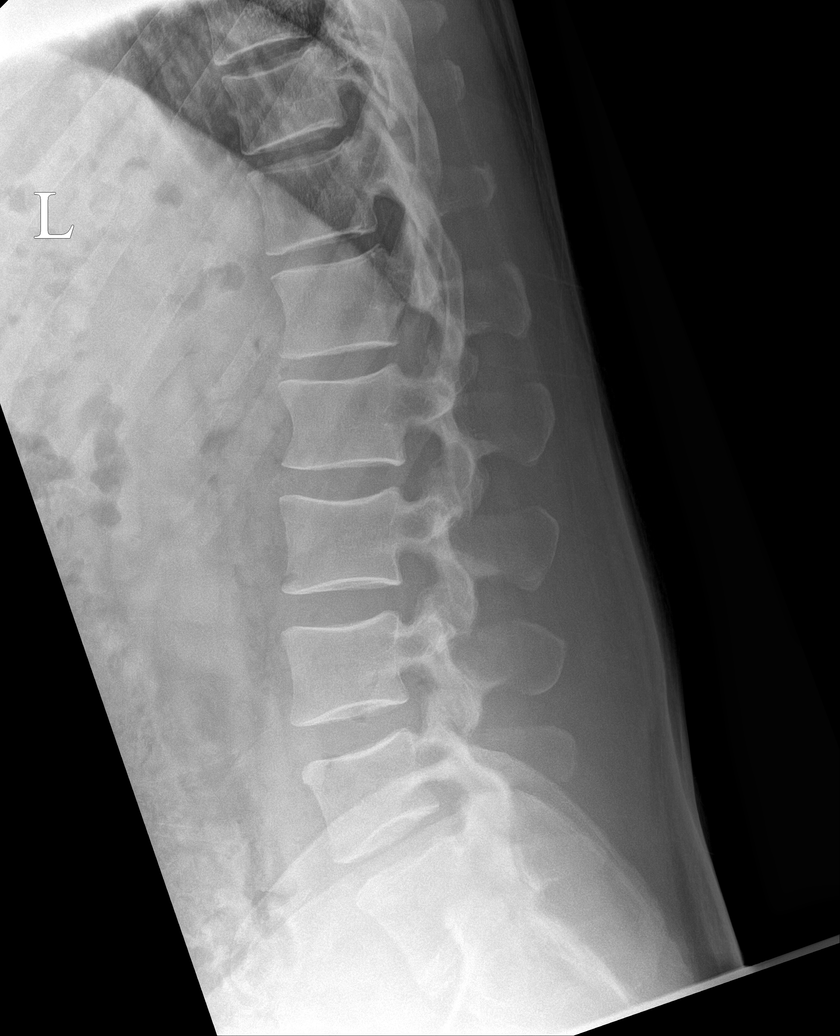

[l-spine spot]
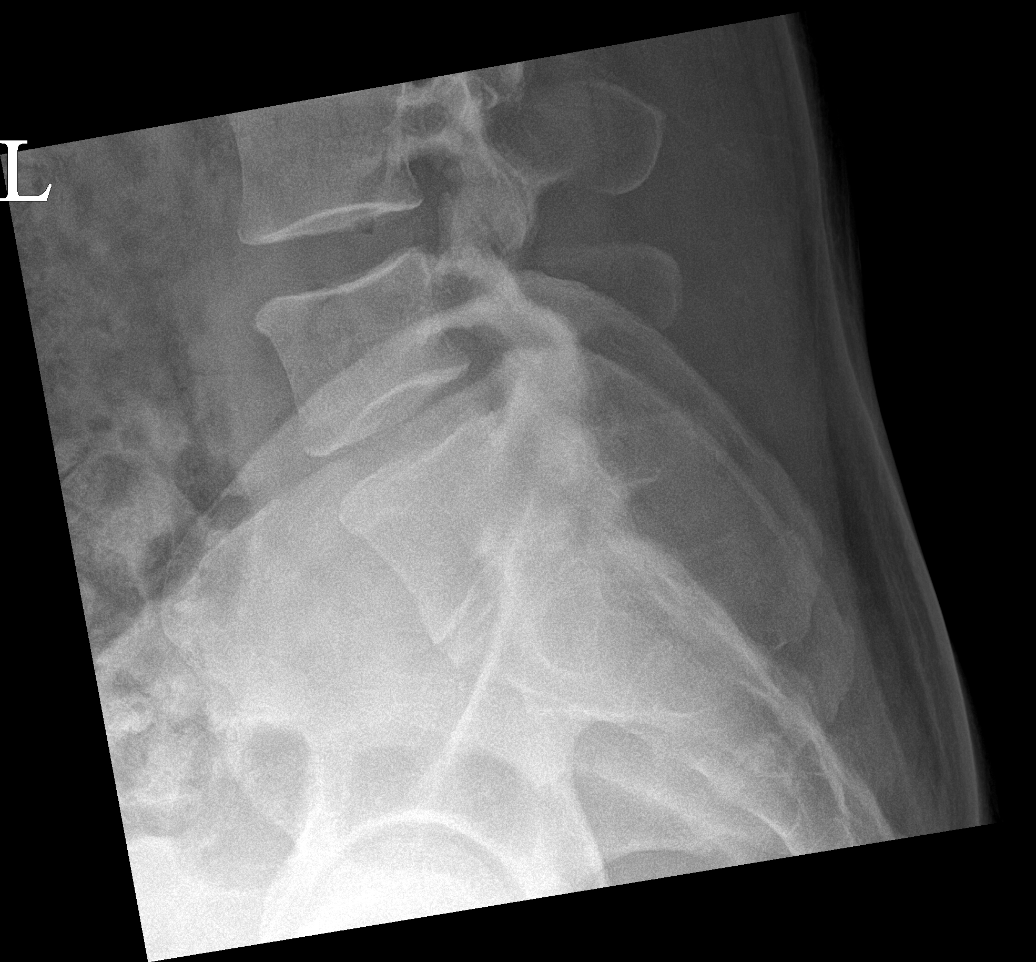

[5 of 5 positions shown; findings below may reference images not displayed]

FINDINGS: Transitional lumbar anatomy with a partially sacralized S1 and a
hypoplastic disc space at S1-2. There is no evidence of lumbar spine
fracture. Alignment is normal. Intervertebral disc spaces are
maintained.
IMPRESSION: Transitional lumbar anatomy.  Otherwise unremarkable examination.

## 2023-05-12 IMAGING — CT CT CHEST W/O CM
1 of 2 series · 15 of 32 positions shown, 19 images · non-contrast
Comparison: CT abdomen and pelvis 09/10/2015.

CLINICAL DATA: Lump on left side of chest.  Lung nodule.



[Series 4: super d · axial · 0.83mm/px · z∈[-423,-72]mm · 15 of 491 slices shown, 19 images]
[im 26/491  mediastinal]
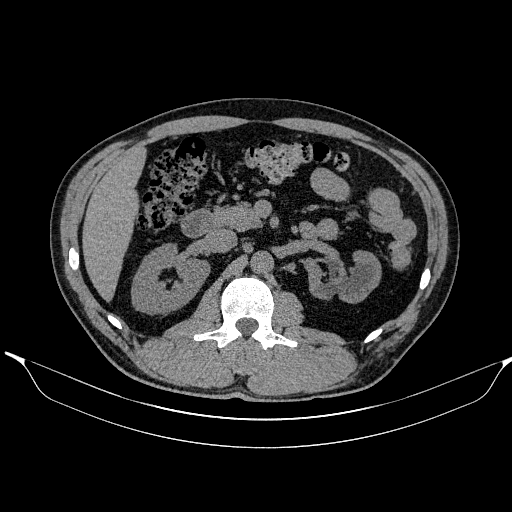
[im 26/491  lung]
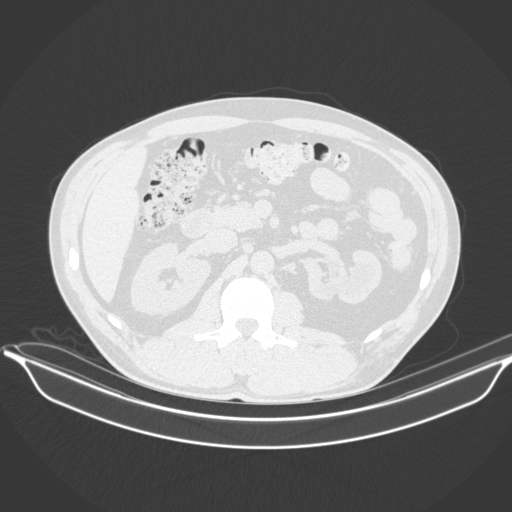
[im 78/491  lung]
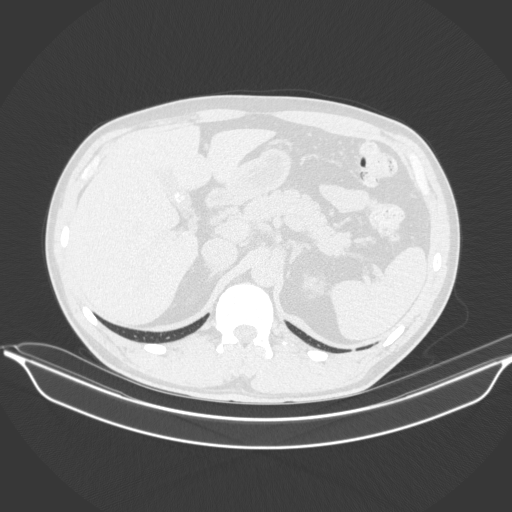
[im 104/491  lung]
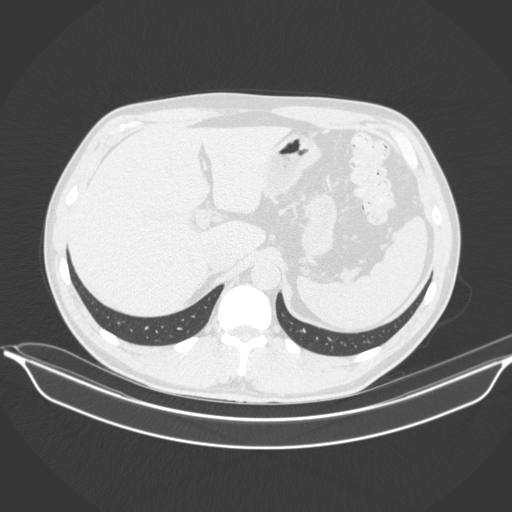
[im 129/491  lung]
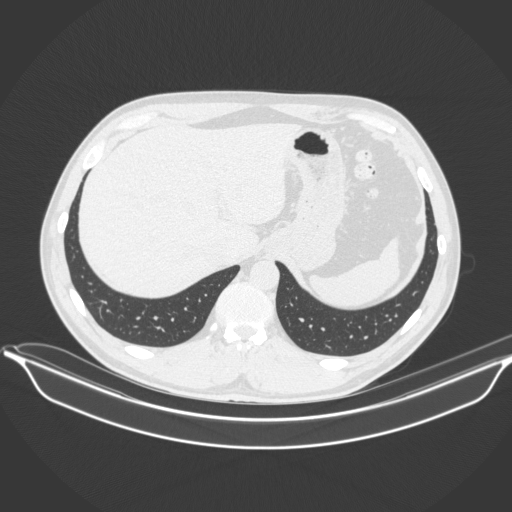
[im 164/491  mediastinal]
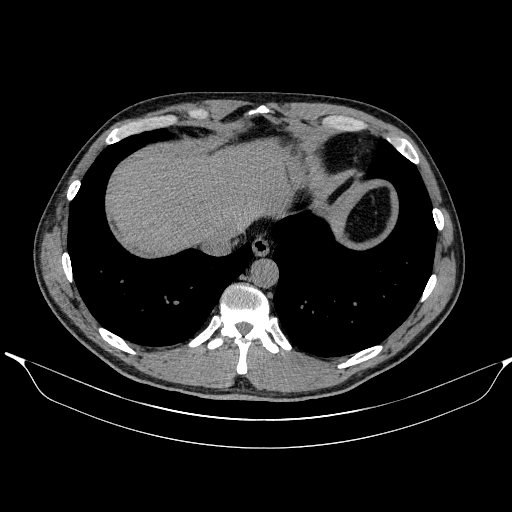
[im 164/491  lung]
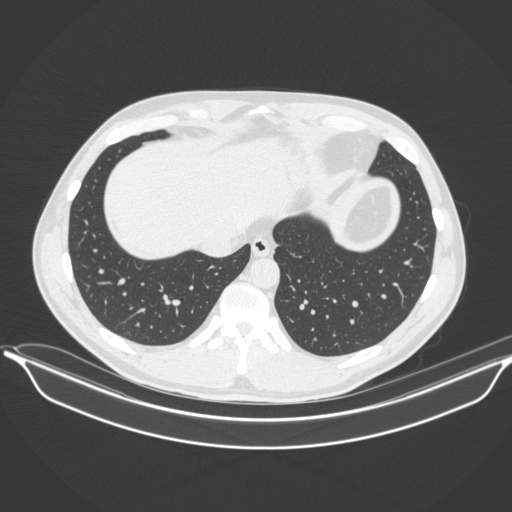
[im 181/491  lung]
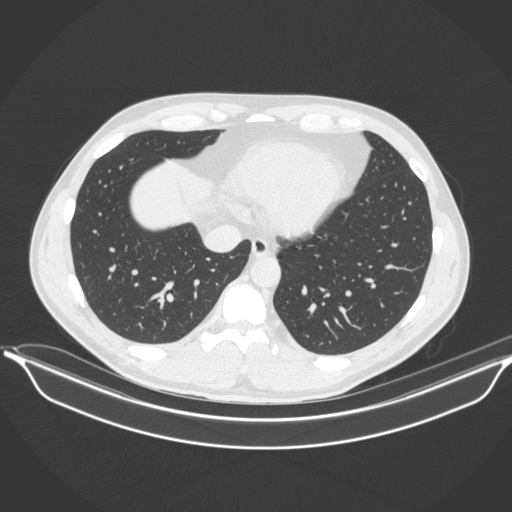
[im 233/491  lung]
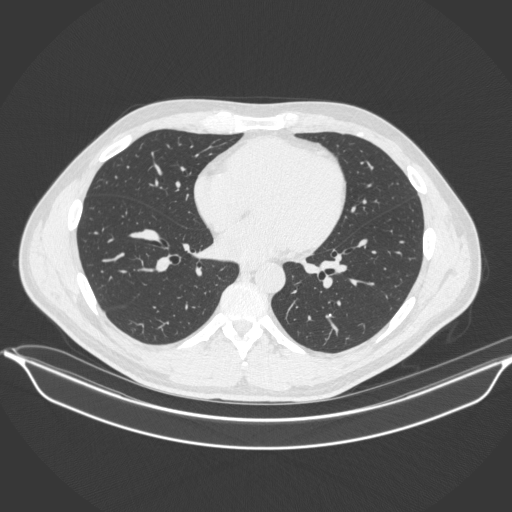
[im 234/491  lung]
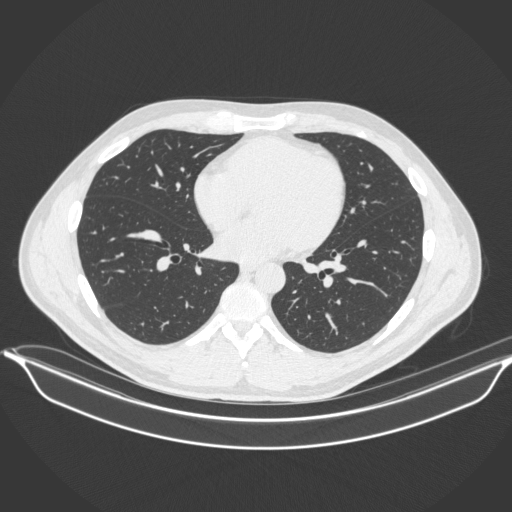
[im 258/491  mediastinal]
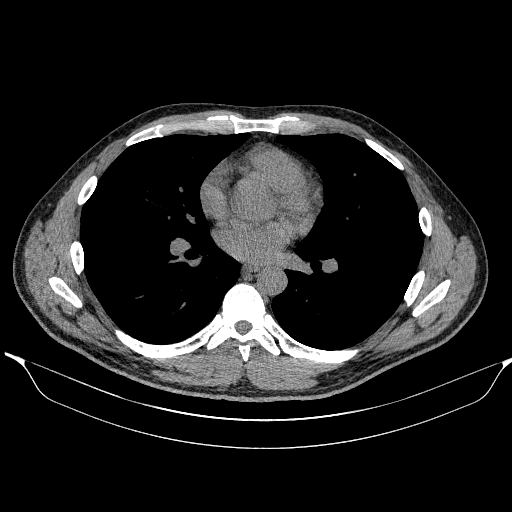
[im 258/491  lung]
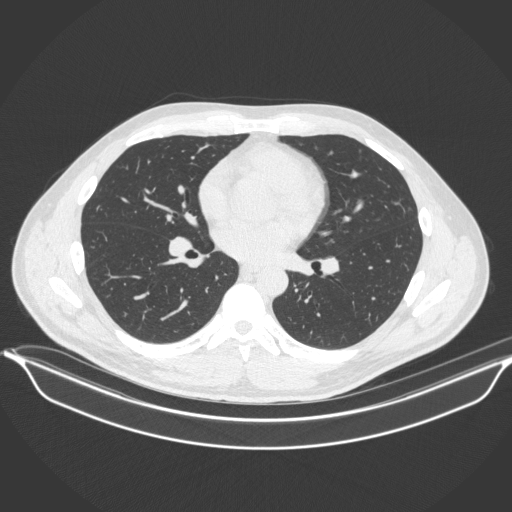
[im 310/491  lung]
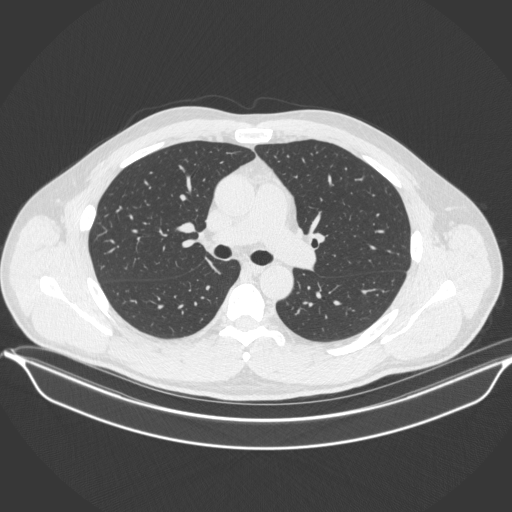
[im 327/491  lung]
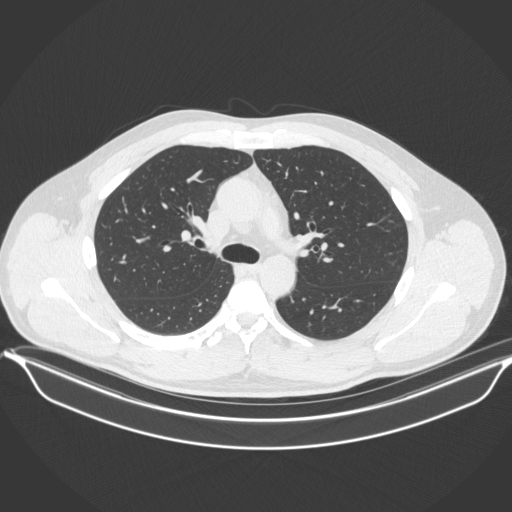
[im 362/491  lung]
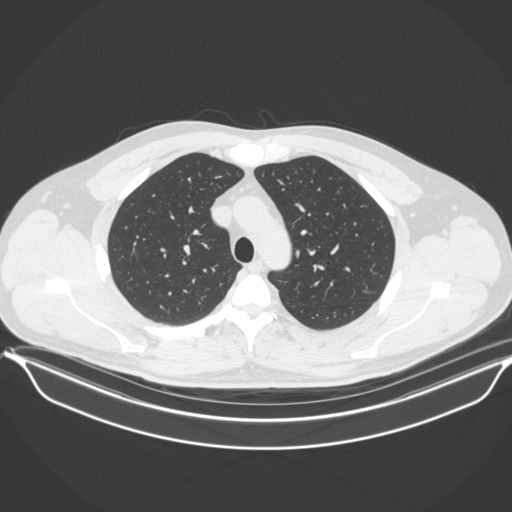
[im 387/491  mediastinal]
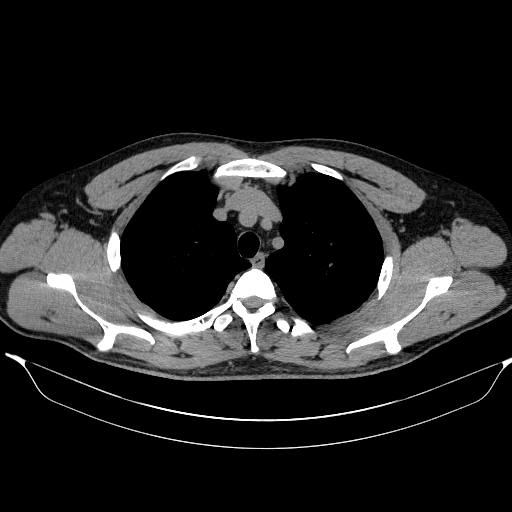
[im 387/491  lung]
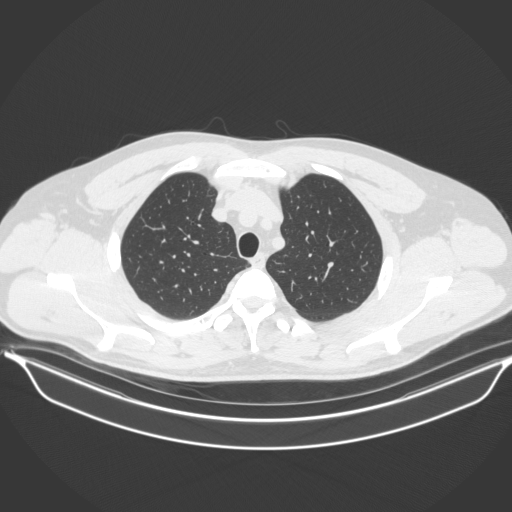
[im 413/491  lung]
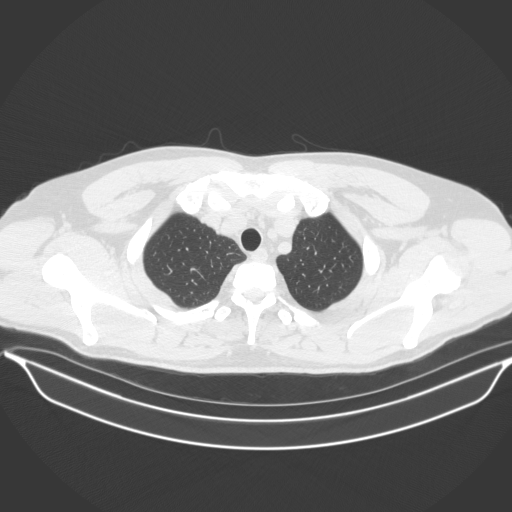
[im 465/491  lung]
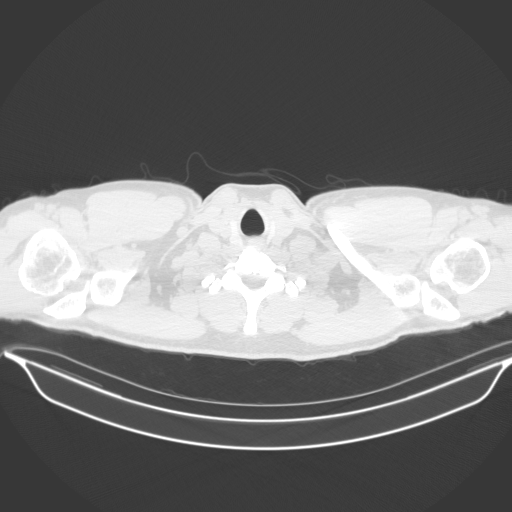

[15 of 32 positions shown; findings below may reference images not displayed]

FINDINGS: Cardiovascular: No significant vascular findings. Normal heart size.
No pericardial effusion.

Mediastinum/Nodes: There are calcified left hilar lymph nodes. No
enlarged lymph nodes are seen. Esophagus and thyroid gland are
within normal limits.

Lungs/Pleura: There is a 4 mm nodule in the left lower lobe image
3/114. There is a calcified granuloma in the left lower lobe. The
lungs are otherwise clear.

Upper Abdomen: Gallstones are present. There is scarring in the left
kidney with hydronephrosis which appears chronic and unchanged from

Musculoskeletal: No chest wall mass or suspicious bone lesions
identified.
IMPRESSION: 1. 4 mm left solid pulmonary nodule. No routine follow-up imaging is
recommended per [HOSPITAL] Guidelines.
These guidelines do not apply to immunocompromised patients and
patients with cancer. Follow up in patients with significant
comorbidities as clinically warranted. For lung cancer screening,
adhere to Lung-RADS guidelines. Reference: Radiology. 7438;
[AGE] granulomatous disease.
3. Cholelithiasis.
4. Chronic changes in the left kidney are similar to the prior CT in

## 2024-02-02 DIAGNOSIS — M5416 Radiculopathy, lumbar region: Secondary | ICD-10-CM | POA: Diagnosis not present

## 2024-02-09 DIAGNOSIS — Z0000 Encounter for general adult medical examination without abnormal findings: Secondary | ICD-10-CM | POA: Diagnosis not present

## 2024-02-09 DIAGNOSIS — E781 Pure hyperglyceridemia: Secondary | ICD-10-CM | POA: Diagnosis not present

## 2024-07-06 ENCOUNTER — Other Ambulatory Visit: Payer: Self-pay

## 2024-07-06 ENCOUNTER — Ambulatory Visit: Admitting: Family Medicine

## 2024-07-06 VITALS — BP 118/76 | HR 83 | Ht 67.0 in | Wt 174.0 lb

## 2024-07-06 DIAGNOSIS — M25521 Pain in right elbow: Secondary | ICD-10-CM

## 2024-07-06 DIAGNOSIS — M7712 Lateral epicondylitis, left elbow: Secondary | ICD-10-CM | POA: Diagnosis not present

## 2024-07-06 DIAGNOSIS — M7711 Lateral epicondylitis, right elbow: Secondary | ICD-10-CM

## 2024-07-06 DIAGNOSIS — M25522 Pain in left elbow: Secondary | ICD-10-CM

## 2024-07-06 NOTE — Patient Instructions (Addendum)
 Thank you for coming in today.   Please work on the home exercises the athletic trainer went over with you:  View at [email or web address] code [id number]  Please use Voltaren gel (Generic Diclofenac Gel) up to 4x daily for pain as needed.  This is available over-the-counter as both the name brand Voltaren gel and the generic diclofenac gel.   Can order occupational therapy if needed  Check back as needed

## 2024-07-06 NOTE — Progress Notes (Signed)
   Kyle Ileana Collet, PhD, LAT, ATC acting as a scribe for Kyle Lloyd, MD.  Kyle Welch is a 48 y.o. male who presents to [facility] today for bilat elbow pain. Pt was previously seen by Dr. Lloyd in 2023 for L knee pain.  Today, pt c/o bilat elbow pain, R>L, worsening over the last 2 months. Pt locates pain to the lateral aspect of the elbow. Pain will go into the forearm w/ different movements.  Pt is RHD.  Radiates: no Paresthesia: no Grip strength: WNL Aggravates: holding an object  Treatments tried: IBU, ice,   Pertinent review of systems: No fevers or chills  Relevant historical information: Controlled bipolar disorder.   Exam:  BP 118/76   Pulse 83   Ht 5' 7 (1.702 m)   Wt 174 lb (78.9 kg)   SpO2 98%   BMI 27.25 kg/m  General: Well Developed, well nourished, and in no acute distress.   MSK: Bilateral elbows normal-appearing Normal motion. Intact strength. Mildly tender palpation right lateral epicondyle. Pain is present with resisted wrist and finger extension bilateral elbows right much worse than left. Pulses cap refill and sensation are intact distally    Lab and Radiology Results  Diagnostic Limited MSK Ultrasound of: Right lateral epicondyle Mild calcification present at the superficial portion of the common extensor tendon origin at the right lateral epicondyle.  No visible tear of the tendon is present. Impression: Lateral epicondylitis     Assessment and Plan: 48 y.o. male with bilateral elbow pain due to lateral epicondylitis.  Right much worse than left.  Plan for home exercise program and Voltaren gel.  Check back as needed. Could add formal occupational therapy if needed.  PDMP not reviewed this encounter. Orders Placed This Encounter  Procedures   US  LIMITED JOINT SPACE STRUCTURES UP BILAT(NO LINKED CHARGES)    Reason for Exam (SYMPTOM  OR DIAGNOSIS REQUIRED):   bilateral elbow pain    Preferred imaging  location?:   [facility]   No orders of the defined types were placed in this encounter.    Discussed warning signs or symptoms. Please see discharge instructions. Patient expresses understanding.   The above documentation has been reviewed and is accurate and complete Kyle Welch, M.D.

## 2024-10-08 ENCOUNTER — Ambulatory Visit

## 2024-10-08 ENCOUNTER — Ambulatory Visit: Admitting: Podiatry

## 2024-10-08 DIAGNOSIS — M1990 Unspecified osteoarthritis, unspecified site: Secondary | ICD-10-CM | POA: Diagnosis not present

## 2024-10-08 DIAGNOSIS — M778 Other enthesopathies, not elsewhere classified: Secondary | ICD-10-CM

## 2024-10-08 DIAGNOSIS — G5761 Lesion of plantar nerve, right lower limb: Secondary | ICD-10-CM | POA: Diagnosis not present

## 2024-10-08 MED ORDER — MELOXICAM 15 MG PO TABS: 15.0000 mg | ORAL_TABLET | Freq: Every day | ORAL | 0 refills | Status: AC

## 2024-10-08 NOTE — Progress Notes (Signed)
°  Subjective:  Patient ID: Kyle Welch, male    DOB: 1975-11-10,  MRN: [id number]  Chief Complaint  Patient presents with   Toe Pain    Right foot pain in 2nd and 3rd toe     Discussed the use of AI scribe software for clinical note transcription with the patient, who gave verbal consent to proceed.  History of Present Illness Kyle Welch is a 48 year old male who presents with pain in the ball of the foot.  He has had pain in the ball of the foot for about four months after a child stepped on his foot at church. The pain initially improved then returned as persistent tenderness at the metatarsal heads involving the joint and plantar padded area. He describes a burning pain that can occur even at rest and can be severe enough to limit walking. Supportive-soled shoes give partial relief but symptoms persist. He denies prior foot problems before this injury.  He also has chronic tendinitis and joint tenderness in his hands and elbows with swelling of the finger joints, which makes it hard to wear his wedding ring. He has not been evaluated for inflammatory arthritis.  He is allergic to codeine, which causes itching with prolonged use.      Objective:  There were no vitals filed for this visit.  Physical Exam General: AAO x3, NAD  Dermatological: Skin is warm, dry and supple bilateral. There are no open sores, no preulcerative lesions, no rash or signs of infection present.  Vascular: Dorsalis Pedis artery and Posterior Tibial artery pedal pulses are 2/4 bilateral with immedate capillary fill time. There is no pain with calf compression, swelling, warmth, erythema.   Neruologic: Grossly intact via light touch bilateral.   Musculoskeletal: Tenderness palpation of the right second interspace.  Unable to appreciate any area of pinpoint tenderness.  Small palpable neuroma is identified.  Trace edema.  No erythema or warmth.     No images are attached to the encounter.     Results RADIOLOGY Foot X-ray: Normal bone structure, possible intermetatarsal neuroma or inflammation.   Assessment:   1. Neuroma of second interspace of right foot   2. Arthritis      Plan:  Patient was evaluated and treated and all questions answered.  Assessment and Plan Assessment & Plan Neuroma of right foot Chronic pain and burning in the ball of the right foot, likely neuroma. X-rays normal, suspect nerve inflammation or thickening. - Administered steroid injection to reduce inflammation.  Verbal consent obtained.  Then the skin with alcohol.  Mixture 1 cc Kenalog 10, 0.5 cc of Marcaine  plain, 0.5 cc of lidocaine  plain was infiltrated into the second interspace without complications.  Postinjection care discussed.  Tolerated well. - Prescribed Celebrex  for anti-inflammatory effect. - Provided metatarsal pads to reduce pressure. - Advised icing to manage inflammation.  Chronic tendinitis, multiple sites Chronic tendinitis with pain in multiple sites, potential underlying inflammatory arthritis due to joint tenderness and swelling. - Ordered blood work for inflammatory arthritis evaluation.     No follow-ups on file.   Donnice JONELLE Fees DPM

## 2024-10-08 NOTE — Patient Instructions (Signed)

## 2024-10-10 ENCOUNTER — Other Ambulatory Visit: Payer: Self-pay | Admitting: Podiatry

## 2024-10-10 DIAGNOSIS — M778 Other enthesopathies, not elsewhere classified: Secondary | ICD-10-CM

## 2024-10-10 DIAGNOSIS — M1990 Unspecified osteoarthritis, unspecified site: Secondary | ICD-10-CM

## 2024-10-31 LAB — SEDIMENTATION RATE: Sed Rate: 2 mm/h (ref 0–15)

## 2024-10-31 LAB — ANTI-CCP AB, IGG + IGA (RDL)

## 2024-10-31 LAB — C-REACTIVE PROTEIN: CRP: <1 mg/L (ref 0–10)

## 2024-10-31 LAB — ANA: Anti Nuclear Antibody (ANA): NEGATIVE

## 2024-11-03 LAB — HLA-B27 ANTIGEN: HLA B27: NEGATIVE

## 2024-11-03 LAB — RHEUMATOID FACTOR: Rheumatoid fact SerPl-aCnc: <10 [IU]/mL

## 2024-11-06 ENCOUNTER — Ambulatory Visit: Payer: Self-pay | Admitting: Podiatry
